# Patient Record
Sex: Male | Born: 1945 | Race: White | Hispanic: No | Marital: Married | Smoking: Never smoker
Health system: Southern US, Community
[De-identification: ages and names within clinical notes are randomized; demographics above are authoritative.]

## PROBLEM LIST (undated history)

## (undated) DIAGNOSIS — C61 Malignant neoplasm of prostate: Secondary | ICD-10-CM

## (undated) DIAGNOSIS — I10 Essential (primary) hypertension: Secondary | ICD-10-CM

## (undated) DIAGNOSIS — K219 Gastro-esophageal reflux disease without esophagitis: Secondary | ICD-10-CM

## (undated) HISTORY — PX: TONSILLECTOMY: SUR1361

---

## 2002-03-03 ENCOUNTER — Ambulatory Visit (HOSPITAL_COMMUNITY): Admission: RE | Admit: 2002-03-03 | Discharge: 2002-03-03 | Payer: Self-pay | Admitting: Gastroenterology

## 2002-03-03 ENCOUNTER — Encounter (INDEPENDENT_AMBULATORY_CARE_PROVIDER_SITE_OTHER): Payer: Self-pay | Admitting: Specialist

## 2006-08-18 ENCOUNTER — Encounter: Admission: RE | Admit: 2006-08-18 | Discharge: 2006-08-18 | Payer: Self-pay | Admitting: Internal Medicine

## 2008-09-11 IMAGING — US US ABDOMEN COMPLETE
1 series · 14 of 25 positions shown · non-contrast
Comparison: None.

CLINICAL DATA: Post prandial abdominal pain.  
 COMPLETE ABDOMINAL ULTRASOUND:
TECHNIQUE: Complete abdominal ultrasound examination was performed including evaluation of the liver, gallbladder, bile ducts, pancreas, kidneys, spleen, IVC, and abdominal aorta.

[Series 1: us abdomen complete · 0.28mm/px · 14 of 108 slices shown]
[im 1/108]
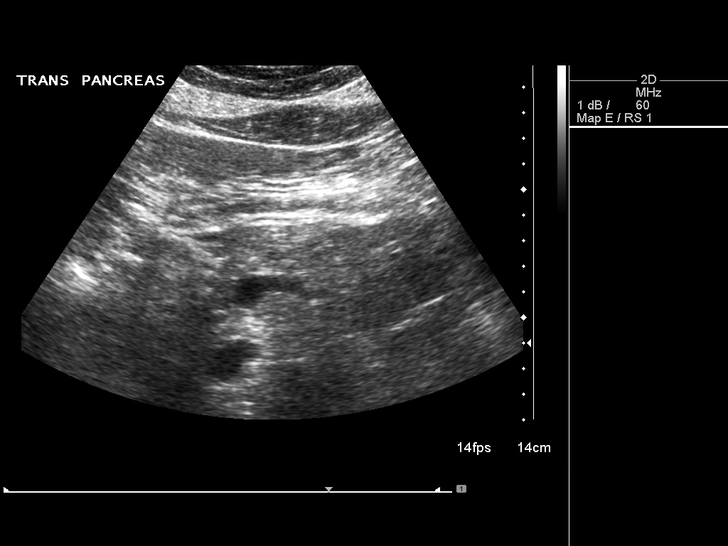
[im 9/108]
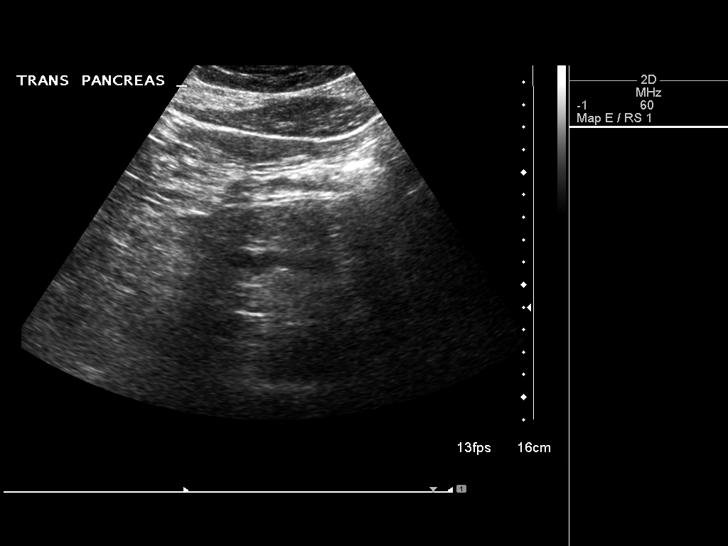
[im 18/108]
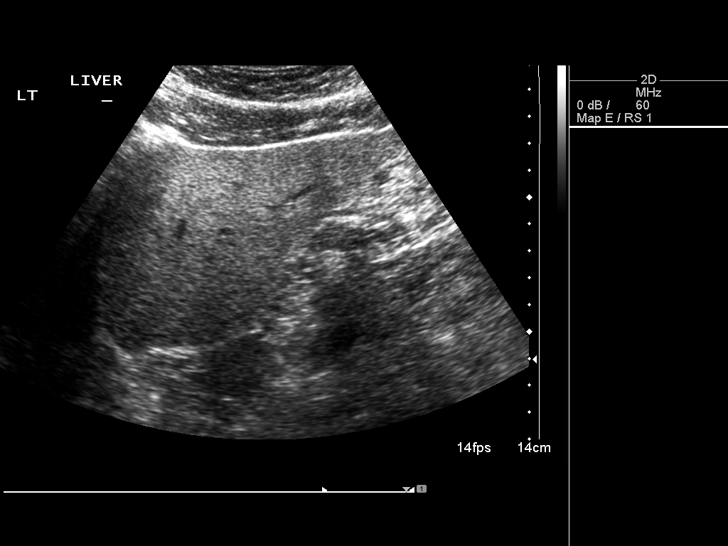
[im 27/108]
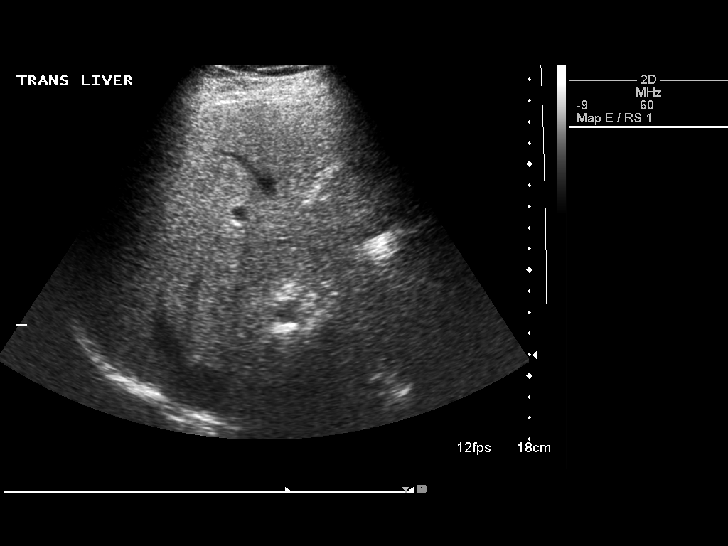
[im 36/108]
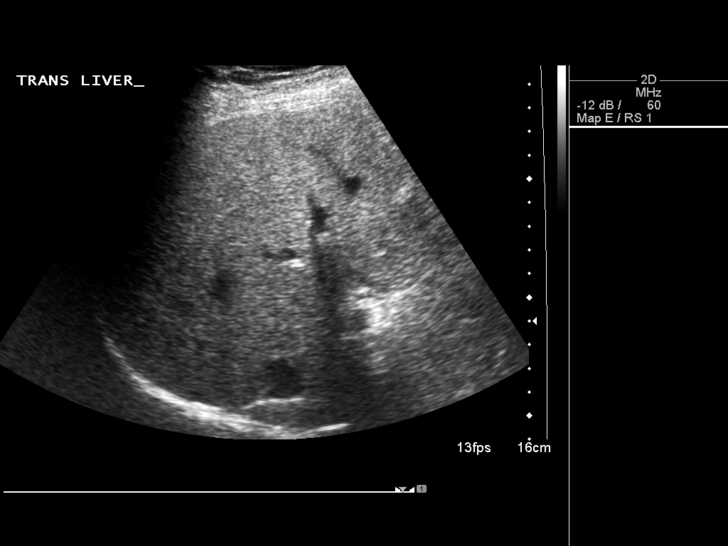
[im 41/108]
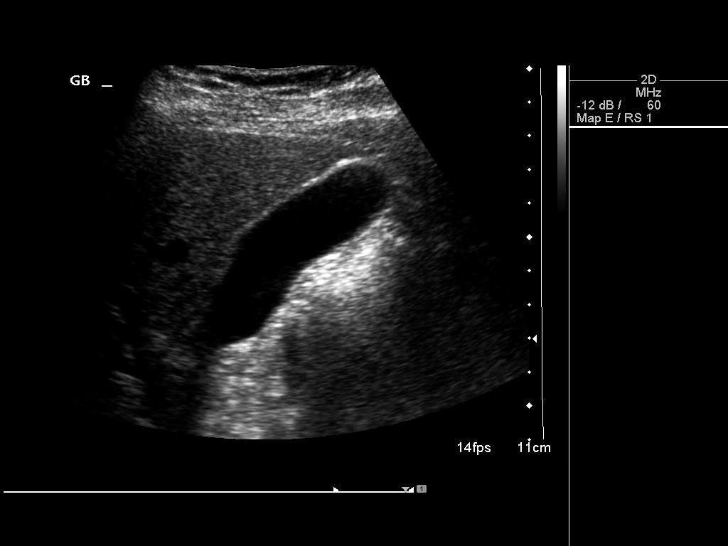
[im 50/108]
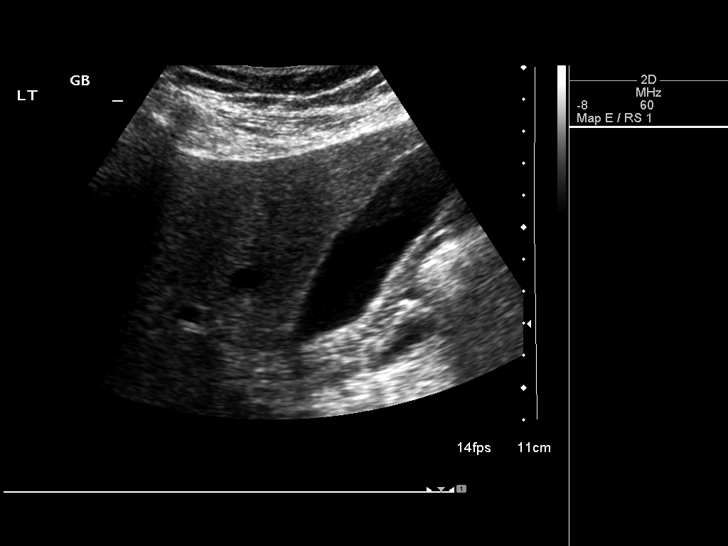
[im 58/108]
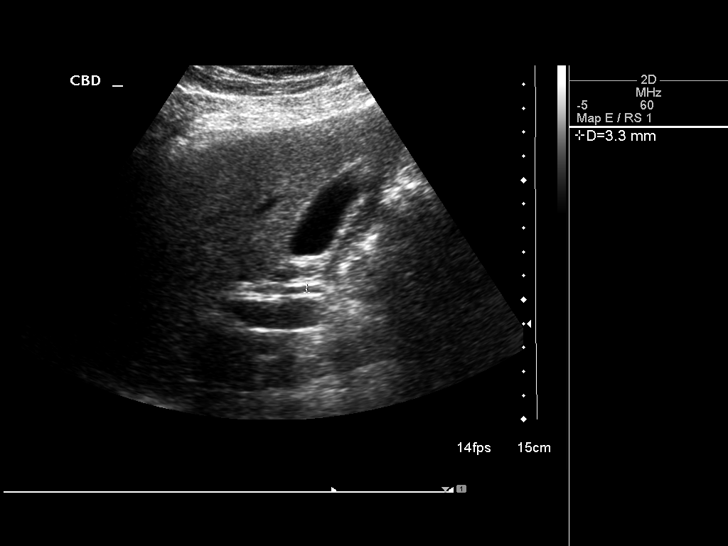
[im 67/108]
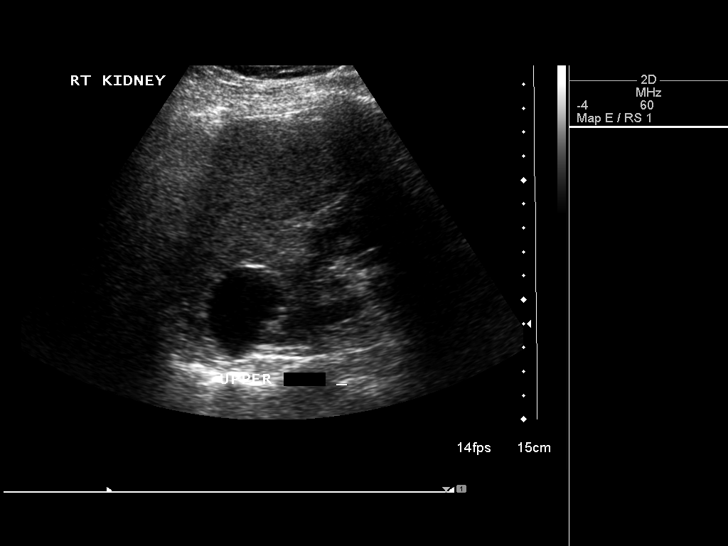
[im 72/108]
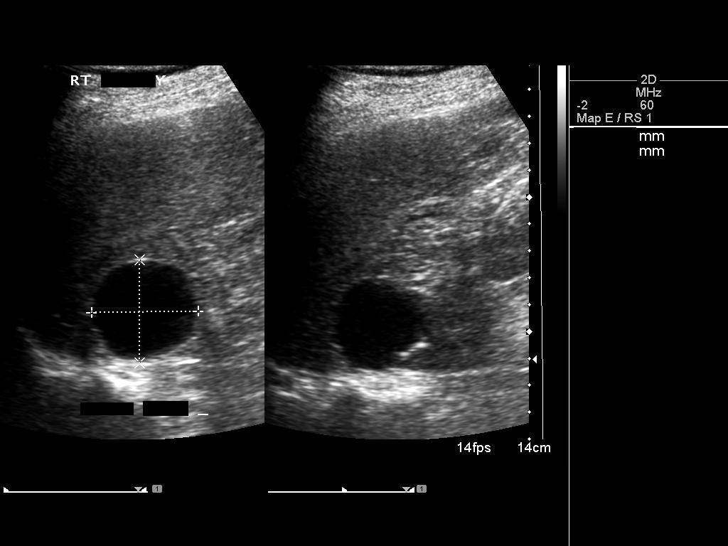
[im 81/108]
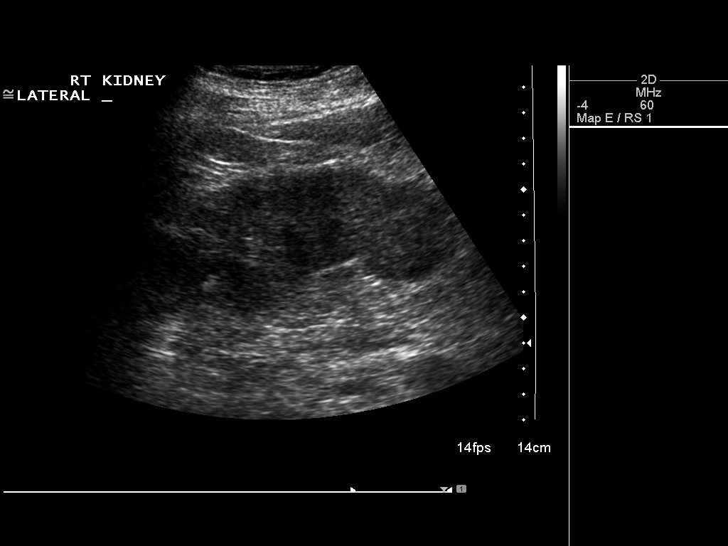
[im 90/108]
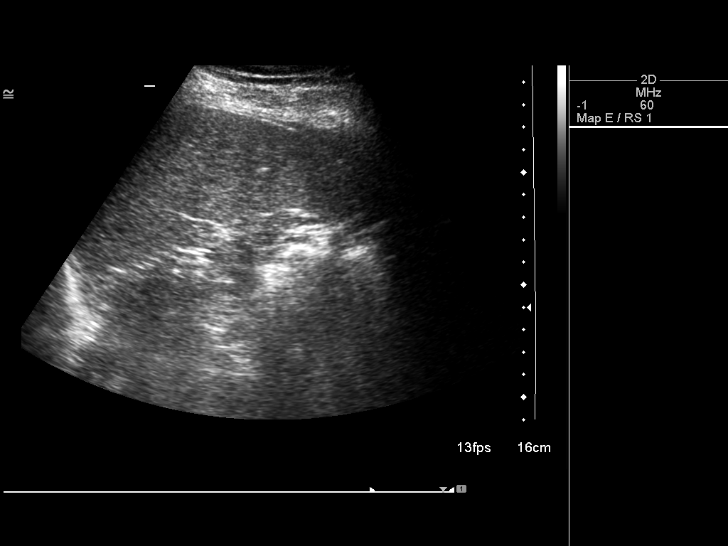
[im 99/108]
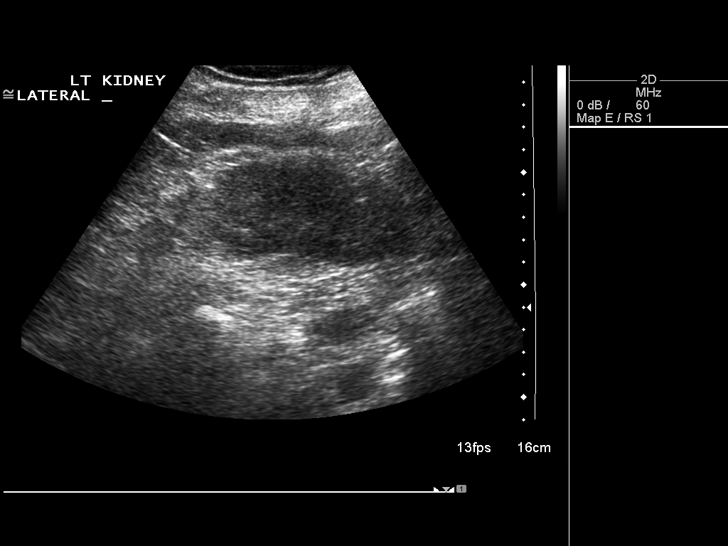
[im 108/108]
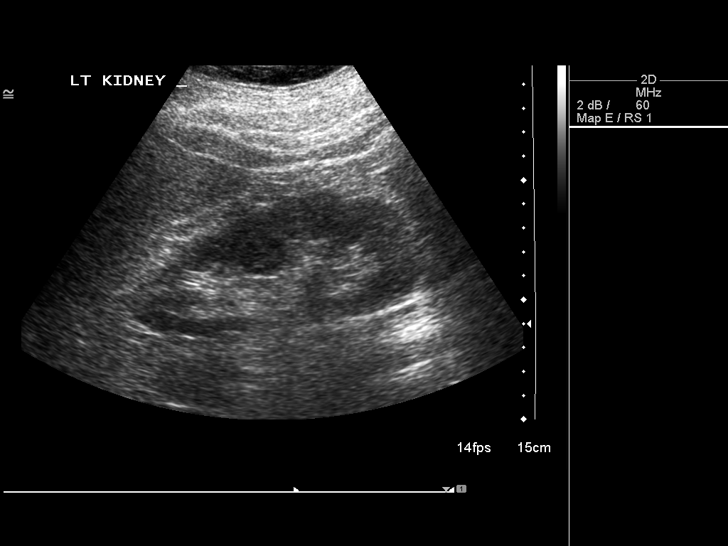

[14 of 25 positions shown; findings below may reference images not displayed]

FINDINGS: Liver, gallbladder, extrahepatic bile duct, and IVC are unremarkable.  Visualization of the pancreas is limited.  Spleen measures 13.2cm.  Right kidney contains a 4.0 x 3.8 x 3.8cm anechoic lesion with increased through transmission and calcification along the posterior wall.  Kidneys and aorta are otherwise unremarkable.
IMPRESSION: 1.  Mildly complex right renal cyst. 
 2.  Mild splenomegaly.

## 2010-06-15 NOTE — Op Note (Signed)
NAME:  [name], [name]                   ACCOUNT NO.:  [id number]   MEDICAL RECORD NO.:  [id number]                   PATIENT TYPE:  AMB   LOCATION:  ENDO                                 FACILITY:  [facility]   PHYSICIAN:  Danise Edge, M.D.                DATE OF BIRTH:  [birth year]   DATE OF PROCEDURE:  03/03/2002  DATE OF DISCHARGE:                                 OPERATIVE REPORT   PROCEDURES:  1. Esophagogastroduodenoscopy to evaluate gastroesophageal reflux     (heartburn).  2. Colonoscopy with rectal polypectomy.   INDICATIONS:  The patient is a 65 year old male born Jan 17, 2046.  On 12/30/01  the patient underwent a health maintenance flexible proctosigmoidoscopy, and  a small polyp was discovered at 10 cm from the anal verge.  The patient used  to have chronic gastroesophageal reflux (heartburn) but rarely has heartburn  presently.   ENDOSCOPIST:  Danise Edge, M.D.   PREMEDICATION:  Versed 10 mg, Demerol 50 mg.   ENDOSCOPE:  Olympus gastroscope and adult colonoscope.   DESCRIPTION OF PROCEDURE:  After obtaining informed consent, the patient was  placed in the left lateral decubitus position.  I administered intravenous  Demerol and intravenous Versed to achieve conscious sedation for the  procedure.  The patient's blood pressure, oxygen saturation, and cardiac  rhythm were monitored throughout the procedure and documented in the medical  record.   PROCEDURE:  Esophagogastroduodenoscopy.   FINDINGS:  The Olympus gastroscope was passed through the posterior  hypopharynx into the proximal esophagus without difficulty.  The  hypopharynx, larynx, and vocal cords appeared normal.   Esophagoscopy:  The proximal and midsegments of the esophagus appeared  normal.  There is a shallow Schatzki's ring at the esophagogastric junction,  which is noted at approximately 37 cm from the incisor teeth.  Endoscopically there is no evidence for the presence of erosive esophagitis,  Barrett's esophagus, or esophageal ulceration.   Gastroscopy:  There is a moderate-sized hiatal hernia.  Retroflexed view of  the gastric cardia and fundus was normal.  The diaphragmatic hiatus was  quite patulous.  The gastric body, antrum, and pylorus appeared normal.   Duodenoscopy:  There are scattered erosions in the duodenal bulb without  ulceration.  The patient is on 81 mg aspirin daily.  The descending duodenum  appeared normal.   ASSESSMENT:  1. Shallow Schatzki's ring at the esophagogastric junction associated with a     moderate-sized hiatal hernia.  2. Scattered erosions in the duodenal bulb with the patient on aspirin 81 mg     daily.  No peptic ulcer disease.   PROCEDURE:  Colonoscopy with rectal polypectomy.   FINDINGS:  Anal inspection was normal.  Digital rectal exam was normal.  The  Olympus adult colonoscope was introduced into the rectum and advanced to the  cecum.  Colonic preparation for the exam today was excellent.   Rectum:  At approximately 10 cm from the anal verge, a  2-3 mm sessile polyp  was removed with electrocautery snare and submitted for pathologic  interpretation.   Sigmoid colon and descending colon normal.   Splenic flexure normal.   Transverse colon normal.   Hepatic flexure normal.   Ascending colon normal.   Cecum and ileocecal valve normal.   ASSESSMENT:  A small rectal polyp was removed at 10 cm from the anal verge  and sent for pathologic interpretation.   RECOMMENDATIONS:  Repeat colonoscopy in five years.                                               Danise Edge, M.D.    MJ/MEDQ  D:  03/03/2002  T:  03/03/2002  Job:  [id number]   cc:   Candyce Churn, M.D.  [address] [place]  [place] [address]  Fax: [phone]

## 2011-03-18 DIAGNOSIS — E291 Testicular hypofunction: Secondary | ICD-10-CM | POA: Diagnosis not present

## 2011-09-17 DIAGNOSIS — N529 Male erectile dysfunction, unspecified: Secondary | ICD-10-CM | POA: Diagnosis not present

## 2011-09-17 DIAGNOSIS — E291 Testicular hypofunction: Secondary | ICD-10-CM | POA: Diagnosis not present

## 2012-01-16 DIAGNOSIS — N529 Male erectile dysfunction, unspecified: Secondary | ICD-10-CM | POA: Diagnosis not present

## 2012-01-16 DIAGNOSIS — Z0000 Encounter for general adult medical examination without abnormal findings: Secondary | ICD-10-CM | POA: Diagnosis not present

## 2012-01-16 DIAGNOSIS — K219 Gastro-esophageal reflux disease without esophagitis: Secondary | ICD-10-CM | POA: Diagnosis not present

## 2012-01-16 DIAGNOSIS — Z79899 Other long term (current) drug therapy: Secondary | ICD-10-CM | POA: Diagnosis not present

## 2012-01-16 DIAGNOSIS — M1710 Unilateral primary osteoarthritis, unspecified knee: Secondary | ICD-10-CM | POA: Diagnosis not present

## 2012-01-16 DIAGNOSIS — E291 Testicular hypofunction: Secondary | ICD-10-CM | POA: Diagnosis not present

## 2012-01-16 DIAGNOSIS — I10 Essential (primary) hypertension: Secondary | ICD-10-CM | POA: Diagnosis not present

## 2012-01-16 DIAGNOSIS — Z125 Encounter for screening for malignant neoplasm of prostate: Secondary | ICD-10-CM | POA: Diagnosis not present

## 2012-01-16 DIAGNOSIS — E7800 Pure hypercholesterolemia, unspecified: Secondary | ICD-10-CM | POA: Diagnosis not present

## 2012-02-26 DIAGNOSIS — N410 Acute prostatitis: Secondary | ICD-10-CM | POA: Diagnosis not present

## 2012-03-31 ENCOUNTER — Other Ambulatory Visit: Payer: Self-pay | Admitting: Gastroenterology

## 2012-03-31 DIAGNOSIS — K621 Rectal polyp: Secondary | ICD-10-CM | POA: Diagnosis not present

## 2012-03-31 DIAGNOSIS — Z86010 Personal history of colonic polyps: Secondary | ICD-10-CM | POA: Diagnosis not present

## 2012-03-31 DIAGNOSIS — K620 Anal polyp: Secondary | ICD-10-CM | POA: Diagnosis not present

## 2012-03-31 DIAGNOSIS — D126 Benign neoplasm of colon, unspecified: Secondary | ICD-10-CM | POA: Diagnosis not present

## 2012-03-31 DIAGNOSIS — Z09 Encounter for follow-up examination after completed treatment for conditions other than malignant neoplasm: Secondary | ICD-10-CM | POA: Diagnosis not present

## 2012-04-08 DIAGNOSIS — M25569 Pain in unspecified knee: Secondary | ICD-10-CM | POA: Diagnosis not present

## 2012-04-22 DIAGNOSIS — N410 Acute prostatitis: Secondary | ICD-10-CM | POA: Diagnosis not present

## 2012-04-22 DIAGNOSIS — R972 Elevated prostate specific antigen [PSA]: Secondary | ICD-10-CM | POA: Diagnosis not present

## 2012-05-28 DIAGNOSIS — N410 Acute prostatitis: Secondary | ICD-10-CM | POA: Diagnosis not present

## 2012-06-19 DIAGNOSIS — E291 Testicular hypofunction: Secondary | ICD-10-CM | POA: Diagnosis not present

## 2012-06-19 DIAGNOSIS — N529 Male erectile dysfunction, unspecified: Secondary | ICD-10-CM | POA: Diagnosis not present

## 2012-07-21 DIAGNOSIS — H524 Presbyopia: Secondary | ICD-10-CM | POA: Diagnosis not present

## 2012-07-21 DIAGNOSIS — H2510 Age-related nuclear cataract, unspecified eye: Secondary | ICD-10-CM | POA: Diagnosis not present

## 2012-07-21 DIAGNOSIS — H5200 Hypermetropia, unspecified eye: Secondary | ICD-10-CM | POA: Diagnosis not present

## 2012-07-21 DIAGNOSIS — H43819 Vitreous degeneration, unspecified eye: Secondary | ICD-10-CM | POA: Diagnosis not present

## 2012-10-31 DIAGNOSIS — Z23 Encounter for immunization: Secondary | ICD-10-CM | POA: Diagnosis not present

## 2013-01-19 DIAGNOSIS — E559 Vitamin D deficiency, unspecified: Secondary | ICD-10-CM | POA: Diagnosis not present

## 2013-01-19 DIAGNOSIS — Z125 Encounter for screening for malignant neoplasm of prostate: Secondary | ICD-10-CM | POA: Diagnosis not present

## 2013-01-19 DIAGNOSIS — Z0000 Encounter for general adult medical examination without abnormal findings: Secondary | ICD-10-CM | POA: Diagnosis not present

## 2013-01-19 DIAGNOSIS — Z79899 Other long term (current) drug therapy: Secondary | ICD-10-CM | POA: Diagnosis not present

## 2013-01-19 DIAGNOSIS — Z1331 Encounter for screening for depression: Secondary | ICD-10-CM | POA: Diagnosis not present

## 2013-01-19 DIAGNOSIS — Z86010 Personal history of colonic polyps: Secondary | ICD-10-CM | POA: Diagnosis not present

## 2013-01-19 DIAGNOSIS — N529 Male erectile dysfunction, unspecified: Secondary | ICD-10-CM | POA: Diagnosis not present

## 2013-01-19 DIAGNOSIS — E7800 Pure hypercholesterolemia, unspecified: Secondary | ICD-10-CM | POA: Diagnosis not present

## 2013-01-19 DIAGNOSIS — J309 Allergic rhinitis, unspecified: Secondary | ICD-10-CM | POA: Diagnosis not present

## 2013-01-19 DIAGNOSIS — E291 Testicular hypofunction: Secondary | ICD-10-CM | POA: Diagnosis not present

## 2013-01-19 DIAGNOSIS — I10 Essential (primary) hypertension: Secondary | ICD-10-CM | POA: Diagnosis not present

## 2013-01-19 DIAGNOSIS — K219 Gastro-esophageal reflux disease without esophagitis: Secondary | ICD-10-CM | POA: Diagnosis not present

## 2013-01-25 DIAGNOSIS — M25569 Pain in unspecified knee: Secondary | ICD-10-CM | POA: Diagnosis not present

## 2013-02-01 DIAGNOSIS — E291 Testicular hypofunction: Secondary | ICD-10-CM | POA: Diagnosis not present

## 2013-02-01 DIAGNOSIS — R972 Elevated prostate specific antigen [PSA]: Secondary | ICD-10-CM | POA: Diagnosis not present

## 2013-02-01 DIAGNOSIS — N529 Male erectile dysfunction, unspecified: Secondary | ICD-10-CM | POA: Diagnosis not present

## 2013-02-24 DIAGNOSIS — E039 Hypothyroidism, unspecified: Secondary | ICD-10-CM | POA: Diagnosis not present

## 2013-07-28 DIAGNOSIS — C61 Malignant neoplasm of prostate: Secondary | ICD-10-CM

## 2013-07-28 HISTORY — DX: Malignant neoplasm of prostate: C61

## 2013-08-03 DIAGNOSIS — N529 Male erectile dysfunction, unspecified: Secondary | ICD-10-CM | POA: Diagnosis not present

## 2013-08-03 DIAGNOSIS — R972 Elevated prostate specific antigen [PSA]: Secondary | ICD-10-CM | POA: Diagnosis not present

## 2013-08-03 DIAGNOSIS — E291 Testicular hypofunction: Secondary | ICD-10-CM | POA: Diagnosis not present

## 2013-08-23 DIAGNOSIS — C61 Malignant neoplasm of prostate: Secondary | ICD-10-CM | POA: Diagnosis not present

## 2013-08-23 DIAGNOSIS — R972 Elevated prostate specific antigen [PSA]: Secondary | ICD-10-CM | POA: Diagnosis not present

## 2013-08-23 HISTORY — PX: PROSTATE BIOPSY: SHX241

## 2013-08-30 DIAGNOSIS — E291 Testicular hypofunction: Secondary | ICD-10-CM | POA: Diagnosis not present

## 2013-08-30 DIAGNOSIS — C61 Malignant neoplasm of prostate: Secondary | ICD-10-CM | POA: Diagnosis not present

## 2013-09-30 DIAGNOSIS — C61 Malignant neoplasm of prostate: Secondary | ICD-10-CM | POA: Diagnosis not present

## 2013-10-30 DIAGNOSIS — Z23 Encounter for immunization: Secondary | ICD-10-CM | POA: Diagnosis not present

## 2013-12-27 DIAGNOSIS — C61 Malignant neoplasm of prostate: Secondary | ICD-10-CM | POA: Diagnosis not present

## 2013-12-31 DIAGNOSIS — E291 Testicular hypofunction: Secondary | ICD-10-CM | POA: Diagnosis not present

## 2013-12-31 DIAGNOSIS — C61 Malignant neoplasm of prostate: Secondary | ICD-10-CM | POA: Diagnosis not present

## 2014-03-01 DIAGNOSIS — K219 Gastro-esophageal reflux disease without esophagitis: Secondary | ICD-10-CM | POA: Diagnosis not present

## 2014-03-01 DIAGNOSIS — E559 Vitamin D deficiency, unspecified: Secondary | ICD-10-CM | POA: Diagnosis not present

## 2014-03-01 DIAGNOSIS — Z0001 Encounter for general adult medical examination with abnormal findings: Secondary | ICD-10-CM | POA: Diagnosis not present

## 2014-03-01 DIAGNOSIS — E291 Testicular hypofunction: Secondary | ICD-10-CM | POA: Diagnosis not present

## 2014-03-01 DIAGNOSIS — I10 Essential (primary) hypertension: Secondary | ICD-10-CM | POA: Diagnosis not present

## 2014-03-01 DIAGNOSIS — N529 Male erectile dysfunction, unspecified: Secondary | ICD-10-CM | POA: Diagnosis not present

## 2014-03-01 DIAGNOSIS — Z79899 Other long term (current) drug therapy: Secondary | ICD-10-CM | POA: Diagnosis not present

## 2014-03-01 DIAGNOSIS — Z1389 Encounter for screening for other disorder: Secondary | ICD-10-CM | POA: Diagnosis not present

## 2014-03-01 DIAGNOSIS — E782 Mixed hyperlipidemia: Secondary | ICD-10-CM | POA: Diagnosis not present

## 2014-03-01 DIAGNOSIS — Z23 Encounter for immunization: Secondary | ICD-10-CM | POA: Diagnosis not present

## 2014-03-01 DIAGNOSIS — C61 Malignant neoplasm of prostate: Secondary | ICD-10-CM | POA: Diagnosis not present

## 2014-03-01 DIAGNOSIS — Z86010 Personal history of colonic polyps: Secondary | ICD-10-CM | POA: Diagnosis not present

## 2014-05-03 DIAGNOSIS — C61 Malignant neoplasm of prostate: Secondary | ICD-10-CM | POA: Diagnosis not present

## 2014-05-03 DIAGNOSIS — E291 Testicular hypofunction: Secondary | ICD-10-CM | POA: Diagnosis not present

## 2014-05-17 DIAGNOSIS — I10 Essential (primary) hypertension: Secondary | ICD-10-CM | POA: Diagnosis not present

## 2014-05-17 DIAGNOSIS — H2513 Age-related nuclear cataract, bilateral: Secondary | ICD-10-CM | POA: Diagnosis not present

## 2014-05-17 DIAGNOSIS — H35033 Hypertensive retinopathy, bilateral: Secondary | ICD-10-CM | POA: Diagnosis not present

## 2014-05-17 DIAGNOSIS — H43813 Vitreous degeneration, bilateral: Secondary | ICD-10-CM | POA: Diagnosis not present

## 2014-08-15 DIAGNOSIS — C61 Malignant neoplasm of prostate: Secondary | ICD-10-CM | POA: Diagnosis not present

## 2014-08-15 HISTORY — PX: PROSTATE BIOPSY: SHX241

## 2014-08-29 DIAGNOSIS — C61 Malignant neoplasm of prostate: Secondary | ICD-10-CM | POA: Diagnosis not present

## 2014-08-31 DIAGNOSIS — S01511A Laceration without foreign body of lip, initial encounter: Secondary | ICD-10-CM | POA: Diagnosis not present

## 2014-09-09 DIAGNOSIS — C61 Malignant neoplasm of prostate: Secondary | ICD-10-CM | POA: Diagnosis not present

## 2014-09-20 ENCOUNTER — Encounter: Payer: Self-pay | Admitting: Radiation Oncology

## 2014-09-20 NOTE — Progress Notes (Signed)
GU Location of Tumor / Histology: Adenocarcinoma of the Prostate     If Prostate Cancer, Gleason Score is (4 + 3) and PSA is (4.69)  [name] presented in July 2015 with a low risk prostate cancer.  His PSA at that time was 5.9.  Digital exam revealed "unremarkable and ultrasound revealed a 57 gram prostate without any worrisome features." Gleason score of 3+3=6 at the right apex with 5% core involvement.  Active Surveillance was recommended and he agreed.  With hormone replacement therapy his PSA declined to 3.4 in November 2015, but is off now.  PSA in April was 4.6 which was an increase since the November PSA of 3.4.  Repeat Biopsy July 2016 noted Adenocarcinoma in a single core at the apex in the medial location.  His gleason score changed form a 3+3=6 to a 4+3=7 with 10% core involvement which is classified as intermediate risk.  Past/Anticipated interventions by urology, if any: Prostate Biopsy  Past/Anticipated interventions by medical oncology, if ZGY:FVCBSWH  Weight changes, if any: none  Bowel/Bladder complaints, if any: Intermittent straining to void, complete emptying, No nocturia  Nausea/Vomiting, if any: nausea  Pain issues, if any: None  SAFETY ISSUES:  Prior radiation? No  Pacemaker/ICD? No  Possible current pregnancy?No  Is the patient on methotrexate?No  Current Complaints / other details:

## 2014-09-21 ENCOUNTER — Ambulatory Visit
Admission: RE | Admit: 2014-09-21 | Discharge: 2014-09-21 | Disposition: A | Discharge status: Home / Self Care | Payer: Medicare Other | Source: Ambulatory Visit | Attending: Radiation Oncology | Admitting: Radiation Oncology

## 2014-09-21 ENCOUNTER — Encounter: Payer: Self-pay | Admitting: Radiation Oncology

## 2014-09-21 VITALS — BP 146/87 | HR 61 | Temp 98.2°F | Ht 73.0 in | Wt 215.1 lb

## 2014-09-21 DIAGNOSIS — K219 Gastro-esophageal reflux disease without esophagitis: Secondary | ICD-10-CM | POA: Diagnosis not present

## 2014-09-21 DIAGNOSIS — C61 Malignant neoplasm of prostate: Secondary | ICD-10-CM

## 2014-09-21 DIAGNOSIS — Z7982 Long term (current) use of aspirin: Secondary | ICD-10-CM | POA: Insufficient documentation

## 2014-09-21 DIAGNOSIS — I10 Essential (primary) hypertension: Secondary | ICD-10-CM | POA: Insufficient documentation

## 2014-09-21 DIAGNOSIS — Z79899 Other long term (current) drug therapy: Secondary | ICD-10-CM | POA: Insufficient documentation

## 2014-09-21 HISTORY — DX: Gastro-esophageal reflux disease without esophagitis: K21.9

## 2014-09-21 HISTORY — DX: Malignant neoplasm of prostate: C61

## 2014-09-21 HISTORY — DX: Essential (primary) hypertension: I10

## 2014-09-21 NOTE — Progress Notes (Signed)
[facility] NEW PATIENT EVALUATION  Name: [name] MRN: [id number]  Date:   09/21/2014           DOB: 05-04-1945  Status: outpatient   CC: No primary care provider on file.  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 male who is seen today through the courtesy Dr. 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  His PSA rose to 5.9 in 2015.  Of note is that he was on androgen supplementation at that time.  He underwent ultrasound-guided biopsies with Dr. Risa Grill on 08/23/2013 and 5% of one core from the right apex was diagnostic for Gleason 6 (3+3) adenocarcinoma.  He appropriately elected for active surveillance.  He came off his hormone replacement therapy and his PSA came down to 3.4 by 12/27/2013.  It rose back to 4.69 by 05/03/2014 and he underwent repeat biopsies on 08/15/2014 showing Gleason 7 (4+3) involving 10% of one core from the right apex.  His gland volume was 58 mL.  Based on his intermediate risk group, Dr. Risa Grill discussed with him robotic surgery and has him seen here today for discussion of radiation therapy.  He is doing well from a GU and GI standpoint.  His I PSS score is 3.  He does have erectile dysfunction which responds well to sildenafil.  He is quite active for his age.  PREVIOUS RADIATION THERAPY: No   PAST MEDICAL HISTORY:  has a past medical history of Prostate cancer (07/2013); Esophageal reflux; and Hypertension.     PAST SURGICAL HISTORY:  Past Surgical History  Procedure Laterality Date  . Prostate biopsy  08/23/2013  . Prostate biopsy  08/15/14  . Tonsillectomy       FAMILY HISTORY: family history includes Breast cancer in his mother; Stroke in his father. His father died  following a stroke at 62.  His mother died from old age at 56.  Multiple maternal aunts lived to be well into their 75s.  No family history of prostate cancer.   SOCIAL HISTORY:  reports that he has never smoked. He does not have any smokeless tobacco history on file. He reports that he drinks about 4.2 oz of alcohol per week. He reports that he uses illicit drugs.  Married, 2 children.  Retired from [employer].   ALLERGIES: Review of patient's allergies indicates no known allergies.   MEDICATIONS:  Current Outpatient Prescriptions  Medication Sig Dispense Refill  . aspirin 81 MG tablet Take 81 mg by mouth daily.    Marland Kitchen lisinopril (PRINIVIL,ZESTRIL) 20 MG tablet Take 20 mg by mouth daily. Takes 1/2 tablet    . Omega-3 Fatty Acids (FISH OIL) 500 MG CAPS Take by mouth.    Marland Kitchen omeprazole (PRILOSEC OTC) 20 MG tablet Take 20 mg by mouth daily.    . rosuvastatin (CRESTOR) 40 MG tablet Take 40 mg by mouth daily. Takes 1/2 tab daily    . sildenafil (VIAGRA) 100 MG tablet Take 100 mg by mouth daily as needed for erectile dysfunction.     No current facility-administered medications for this encounter.     REVIEW OF SYSTEMS:  Pertinent items are noted in HPI.    PHYSICAL EXAM:  height is 6\' 1"  (1.854 m) and weight is 215  lb 1.6 oz (97.569 kg). His temperature is 98.2 F (36.8 C). His blood pressure is 146/87 and his pulse is 61.   Alert and oriented 68 year old white male appearing his stated age.  Rectal examination: The prostate gland is slightly enlarged and is without focal induration or nodularity.   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  I explained to the patient that his prognosis is related to his stage, Gleason score, and PSA level.  His stage and PSA level are  favorable although his Gleason score 7 is of intermediate favorability.  Other prognostic factors include PSA doubling time and also disease volume.  He has low disease volume.  Management options include surgery versus radiation therapy.  Radiation therapy options include seed implantation alone or 8 weeks of external beam/IMRT.  We discussed in detail the both radiation therapy options.  We also discussed the potential acute and late toxicities of radiation therapy.  We discussed radiation safety issues with seed implantation.  His prognosis is quite favorable.  He is interested in either surgery or seed implantation which I think are both good choices for him.  He would like to wait until after the hunting season, perhaps later this year, before deciding on his course of therapy.  He knows that we will need to obtain a CT arch study should he want to proceed with seed implantation.  He'll maintain follow-up with Dr. Risa Grill.   PLAN: As discussed above.   I spent 60  minutes face to face with the patient and more than 50% of that time was spent in counseling and/or coordination of care.

## 2014-09-21 NOTE — Addendum Note (Signed)
Encounter addended by: Benn Moulder, RN on: 09/21/2014  7:20 PM<BR>     Documentation filed: Charges VN

## 2014-10-18 DIAGNOSIS — E291 Testicular hypofunction: Secondary | ICD-10-CM | POA: Diagnosis not present

## 2014-10-18 DIAGNOSIS — C61 Malignant neoplasm of prostate: Secondary | ICD-10-CM | POA: Diagnosis not present

## 2014-10-28 ENCOUNTER — Other Ambulatory Visit: Payer: Self-pay | Admitting: Urology

## 2014-11-01 ENCOUNTER — Other Ambulatory Visit: Payer: Self-pay | Admitting: Urology

## 2014-11-16 DIAGNOSIS — M6281 Muscle weakness (generalized): Secondary | ICD-10-CM | POA: Diagnosis not present

## 2014-11-16 DIAGNOSIS — Z8546 Personal history of malignant neoplasm of prostate: Secondary | ICD-10-CM | POA: Diagnosis not present

## 2014-11-16 DIAGNOSIS — C61 Malignant neoplasm of prostate: Secondary | ICD-10-CM | POA: Diagnosis not present

## 2014-11-16 DIAGNOSIS — Z0000 Encounter for general adult medical examination without abnormal findings: Secondary | ICD-10-CM | POA: Diagnosis not present

## 2014-11-28 DIAGNOSIS — C61 Malignant neoplasm of prostate: Secondary | ICD-10-CM | POA: Diagnosis not present

## 2014-11-28 DIAGNOSIS — M6281 Muscle weakness (generalized): Secondary | ICD-10-CM | POA: Diagnosis not present

## 2015-01-02 ENCOUNTER — Encounter (HOSPITAL_COMMUNITY): Payer: Self-pay

## 2015-01-02 ENCOUNTER — Encounter (HOSPITAL_COMMUNITY)
Admission: RE | Admit: 2015-01-02 | Discharge: 2015-01-02 | Disposition: A | Discharge status: Home / Self Care | Payer: Medicare Other | Source: Ambulatory Visit | Attending: Urology | Admitting: Urology

## 2015-01-02 ENCOUNTER — Telehealth: Payer: Self-pay | Admitting: Radiation Oncology

## 2015-01-02 DIAGNOSIS — E785 Hyperlipidemia, unspecified: Secondary | ICD-10-CM | POA: Diagnosis not present

## 2015-01-02 DIAGNOSIS — I10 Essential (primary) hypertension: Secondary | ICD-10-CM | POA: Diagnosis not present

## 2015-01-02 DIAGNOSIS — C61 Malignant neoplasm of prostate: Secondary | ICD-10-CM | POA: Diagnosis not present

## 2015-01-02 DIAGNOSIS — K219 Gastro-esophageal reflux disease without esophagitis: Secondary | ICD-10-CM | POA: Diagnosis not present

## 2015-01-02 LAB — CBC
HCT: 43.2 % (ref 39.0–52.0)
Hemoglobin: 14.5 g/dL (ref 13.0–17.0)
MCH: 30.4 pg (ref 26.0–34.0)
MCHC: 33.6 g/dL (ref 30.0–36.0)
MCV: 90.6 fL (ref 78.0–100.0)
PLATELETS: 210 10*3/uL (ref 150–400)
RBC: 4.77 MIL/uL (ref 4.22–5.81)
RDW: 12.5 % (ref 11.5–15.5)
WBC: 5.3 10*3/uL (ref 4.0–10.5)

## 2015-01-02 LAB — BASIC METABOLIC PANEL
Anion gap: 7 (ref 5–15)
BUN: 17 mg/dL (ref 6–20)
CALCIUM: 9.1 mg/dL (ref 8.9–10.3)
CO2: 27 mmol/L (ref 22–32)
CREATININE: 0.93 mg/dL (ref 0.61–1.24)
Chloride: 105 mmol/L (ref 101–111)
GLUCOSE: 102 mg/dL — AB (ref 65–99)
Potassium: 5.2 mmol/L — ABNORMAL HIGH (ref 3.5–5.1)
Sodium: 139 mmol/L (ref 135–145)

## 2015-01-02 LAB — ABO/RH: ABO/RH(D): O POS

## 2015-01-02 NOTE — Pre-Procedure Instructions (Signed)
EKG done today.

## 2015-01-02 NOTE — Telephone Encounter (Signed)
Received message that patient called over the weekend wanting to know what time his appointment was for Monday. Phoned patient making him aware he had phoned Dr. Katharina Caper office instead of Dr. Clovis Cao office. Provided patient with the number to [facility]. Patient expressed appreciation for the call.

## 2015-01-02 NOTE — Patient Instructions (Addendum)
[place]  01/02/2015   Your procedure is scheduled on:   12-7--2016 Wednesday  Enter through [facility] and follow signs to [facility] to [place]. Arrive at    0630    AM.  (Limit 1 person with you).  Call this number if you have problems the morning of surgery: [phone]  Or Presurgical Testing [phone] days before.   For Living Will and/or Health Care Power Attorney Forms: please provide copy for your medical record,may bring AM of surgery(Forms should be already notarized -we do not provide this service).(Yes- prefers not to bring at this time.).  Remember: Follow any bowel prep instructions per MD office.   Do not eat food/ or drink: After Midnight.     Take these medicines the morning of surgery with A SIP OF WATER-   (DO NOT TAKE ANY DIABETIC MEDS AM OF SURGERY) : NONE.   Do not wear jewelry, make-up or nail polish.  Do not wear deodorant, lotions, powders, or perfumes.   Do not shave legs and under arms- 48 hours(2 days) prior to first CHG shower.(Shaving face and neck okay.)  Do not bring valuables to the hospital.(Hospital is not responsible for lost valuables).  Contacts, dentures or removable bridgework, body piercing, hair pins may not be worn into surgery.  Leave suitcase in the car. After surgery it may be brought to your room.  For patients admitted to the hospital, checkout time is 11:00 AM the day of discharge.(Restricted visitors-Any Persons displaying flu-like symptoms or illness).    Patients discharged the day of surgery will not be allowed to drive home. Must have responsible person with you x 24 hours once discharged.  Name and phone number of your driver: Jenny Reichmann -spouse [phone] cell    Please read over the following fact sheets that you were given:  CHG(Chlorhexidine Gluconate 4% Surgical Soap) use.  Remember : Type/Screen "Blue armbands" - may not be removed once applied(would result in being  retested AM of surgery, if removed).          - Preparing for Surgery Before surgery, you can play an important role.  Because skin is not sterile, your skin needs to be as free of germs as possible.  You can reduce the number of germs on your skin by washing with CHG (chlorahexidine gluconate) soap before surgery.  CHG is an antiseptic cleaner which kills germs and bonds with the skin to continue killing germs even after washing. Please DO NOT use if you have an allergy to CHG or antibacterial soaps.  If your skin becomes reddened/irritated stop using the CHG and inform your nurse when you arrive at Short Stay. Do not shave (including legs and underarms) for at least 48 hours prior to the first CHG shower.  You may shave your face/neck. Please follow these instructions carefully:  1.  Shower with CHG Soap the night before surgery and the  morning of Surgery.  2.  If you choose to wash your hair, wash your hair first as usual with your  normal  shampoo.  3.  After you shampoo, rinse your hair and body thoroughly to remove the  shampoo.                           4.  Use CHG as you would any other liquid soap.  You can apply chg directly  to the skin and  wash                       Gently with a scrungie or clean washcloth.  5.  Apply the CHG Soap to your body ONLY FROM THE NECK DOWN.   Do not use on face/ open                           Wound or open sores. Avoid contact with eyes, ears mouth and genitals (private parts).                       Wash face,  Genitals (private parts) with your normal soap.             6.  Wash thoroughly, paying special attention to the area where your surgery  will be performed.  7.  Thoroughly rinse your body with warm water from the neck down.  8.  DO NOT shower/wash with your normal soap after using and rinsing off  the CHG Soap.                9.  Pat yourself dry with a clean towel.            10.  Wear clean pajamas.            11.  Place clean  sheets on your bed the night of your first shower and do not  sleep with pets. Day of Surgery : Do not apply any lotions/deodorants the morning of surgery.  Please wear clean clothes to the hospital/surgery center.  FAILURE TO FOLLOW THESE INSTRUCTIONS MAY RESULT IN THE CANCELLATION OF YOUR SURGERY PATIENT SIGNATURE_________________________________  NURSE SIGNATURE__________________________________  ________________________________________________________________________

## 2015-01-03 NOTE — Anesthesia Preprocedure Evaluation (Addendum)
Anesthesia Evaluation  Patient identified by MRN, date of birth, ID band Patient awake    Reviewed: Allergy & Precautions, NPO status , Patient's Chart, lab work & pertinent test results  History of Anesthesia Complications Negative for: history of anesthetic complications  Airway Mallampati: II  TM Distance: >3 FB Neck ROM: Full    Dental no notable dental hx. (+) Dental Advisory Given   Pulmonary neg pulmonary ROS,    Pulmonary exam normal breath sounds clear to auscultation       Cardiovascular hypertension, Pt. on medications Normal cardiovascular exam Rhythm:Regular Rate:Normal     Neuro/Psych negative neurological ROS  negative psych ROS   GI/Hepatic Neg liver ROS, GERD  ,  Endo/Other  negative endocrine ROS  Renal/GU negative Renal ROS  negative genitourinary   Musculoskeletal negative musculoskeletal ROS (+)   Abdominal   Peds negative pediatric ROS (+)  Hematology negative hematology ROS (+)   Anesthesia Other Findings Prostate Ca  Reproductive/Obstetrics negative OB ROS                            Anesthesia Physical Anesthesia Plan  ASA: II  Anesthesia Plan: General   Post-op Pain Management:    Induction: Intravenous  Airway Management Planned: Oral ETT  Additional Equipment:   Intra-op Plan:   Post-operative Plan: Extubation in OR  Informed Consent: I have reviewed the patients History and Physical, chart, labs and discussed the procedure including the risks, benefits and alternatives for the proposed anesthesia with the patient or authorized representative who has indicated his/her understanding and acceptance.   Dental advisory given  Plan Discussed with: CRNA  Anesthesia Plan Comments:         Anesthesia Quick Evaluation

## 2015-01-04 ENCOUNTER — Inpatient Hospital Stay (HOSPITAL_COMMUNITY): Payer: Medicare Other | Admitting: Anesthesiology

## 2015-01-04 ENCOUNTER — Encounter (HOSPITAL_COMMUNITY): Payer: Self-pay | Admitting: *Deleted

## 2015-01-04 ENCOUNTER — Encounter (HOSPITAL_COMMUNITY)
Admission: RE | Disposition: A | Discharge status: Home / Self Care | Payer: Self-pay | Source: Ambulatory Visit | Attending: Urology

## 2015-01-04 ENCOUNTER — Inpatient Hospital Stay (HOSPITAL_COMMUNITY)
Admission: RE | Admit: 2015-01-04 | Discharge: 2015-01-05 | DRG: 708 | Disposition: A | Discharge status: Home / Self Care | Payer: Medicare Other | Source: Ambulatory Visit | Attending: Urology | Admitting: Urology

## 2015-01-04 DIAGNOSIS — K219 Gastro-esophageal reflux disease without esophagitis: Secondary | ICD-10-CM | POA: Diagnosis present

## 2015-01-04 DIAGNOSIS — I10 Essential (primary) hypertension: Secondary | ICD-10-CM | POA: Diagnosis present

## 2015-01-04 DIAGNOSIS — Z823 Family history of stroke: Secondary | ICD-10-CM | POA: Diagnosis not present

## 2015-01-04 DIAGNOSIS — Z7982 Long term (current) use of aspirin: Secondary | ICD-10-CM

## 2015-01-04 DIAGNOSIS — Z01812 Encounter for preprocedural laboratory examination: Secondary | ICD-10-CM | POA: Diagnosis not present

## 2015-01-04 DIAGNOSIS — C61 Malignant neoplasm of prostate: Secondary | ICD-10-CM | POA: Diagnosis present

## 2015-01-04 DIAGNOSIS — Z79899 Other long term (current) drug therapy: Secondary | ICD-10-CM | POA: Diagnosis not present

## 2015-01-04 DIAGNOSIS — E785 Hyperlipidemia, unspecified: Secondary | ICD-10-CM | POA: Diagnosis present

## 2015-01-04 DIAGNOSIS — Z803 Family history of malignant neoplasm of breast: Secondary | ICD-10-CM | POA: Diagnosis not present

## 2015-01-04 HISTORY — PX: ROBOT ASSISTED LAPAROSCOPIC RADICAL PROSTATECTOMY: SHX5141

## 2015-01-04 HISTORY — PX: LYMPHADENECTOMY: SHX5960

## 2015-01-04 LAB — TYPE AND SCREEN
ABO/RH(D): O POS
Antibody Screen: NEGATIVE

## 2015-01-04 LAB — HEMOGLOBIN AND HEMATOCRIT, BLOOD
HEMATOCRIT: 41.1 % (ref 39.0–52.0)
Hemoglobin: 13.4 g/dL (ref 13.0–17.0)

## 2015-01-04 SURGERY — ROBOTIC ASSISTED LAPAROSCOPIC RADICAL PROSTATECTOMY
Anesthesia: General

## 2015-01-04 MED ORDER — ONDANSETRON HCL 4 MG/2ML IJ SOLN
INTRAMUSCULAR | Status: AC
  Filled 2015-01-04: qty 2

## 2015-01-04 MED ORDER — ROCURONIUM BROMIDE 100 MG/10ML IV SOLN
INTRAVENOUS | Status: AC
  Filled 2015-01-04: qty 1

## 2015-01-04 MED ORDER — ONDANSETRON HCL 4 MG/2ML IJ SOLN
4.0000 mg | INTRAMUSCULAR | Status: DC | PRN
  Administered 2015-01-05: 4 mg via INTRAVENOUS
  Filled 2015-01-04: qty 2

## 2015-01-04 MED ORDER — ONDANSETRON HCL 4 MG/2ML IJ SOLN: 4.0000 mg | Freq: Once | INTRAMUSCULAR | Status: DC | PRN

## 2015-01-04 MED ORDER — LACTATED RINGERS IR SOLN
Status: DC | PRN
  Administered 2015-01-04: 1

## 2015-01-04 MED ORDER — DIPHENHYDRAMINE HCL 50 MG/ML IJ SOLN: 12.5000 mg | Freq: Four times a day (QID) | INTRAMUSCULAR | Status: DC | PRN

## 2015-01-04 MED ORDER — OMEPRAZOLE MAGNESIUM 20 MG PO TBEC: 20.0000 mg | DELAYED_RELEASE_TABLET | ORAL | Status: DC

## 2015-01-04 MED ORDER — SODIUM CHLORIDE 0.9 % IV BOLUS (SEPSIS)
1000.0000 mL | Freq: Once | INTRAVENOUS | Status: AC
  Administered 2015-01-04: 1000 mL via INTRAVENOUS

## 2015-01-04 MED ORDER — DEXAMETHASONE SODIUM PHOSPHATE 10 MG/ML IJ SOLN
INTRAMUSCULAR | Status: AC
  Filled 2015-01-04: qty 1

## 2015-01-04 MED ORDER — OMEPRAZOLE MAGNESIUM 20 MG PO TBEC
20.0000 mg | DELAYED_RELEASE_TABLET | ORAL | Status: DC
  Administered 2015-01-04: 20 mg via ORAL
  Filled 2015-01-04: qty 1

## 2015-01-04 MED ORDER — SODIUM CHLORIDE 0.9 % IJ SOLN
INTRAMUSCULAR | Status: AC
  Filled 2015-01-04: qty 10

## 2015-01-04 MED ORDER — SUGAMMADEX SODIUM 500 MG/5ML IV SOLN
INTRAVENOUS | Status: DC | PRN
  Administered 2015-01-04: 400 mg via INTRAVENOUS

## 2015-01-04 MED ORDER — ROSUVASTATIN CALCIUM 20 MG PO TABS
20.0000 mg | ORAL_TABLET | Freq: Every day | ORAL | Status: DC
  Administered 2015-01-04: 20 mg via ORAL
  Filled 2015-01-04: qty 1

## 2015-01-04 MED ORDER — FENTANYL CITRATE (PF) 100 MCG/2ML IJ SOLN: 25.0000 ug | INTRAMUSCULAR | Status: DC | PRN

## 2015-01-04 MED ORDER — OXYCODONE HCL 5 MG PO TABS: 5.0000 mg | ORAL_TABLET | ORAL | Status: DC | PRN

## 2015-01-04 MED ORDER — DIPHENHYDRAMINE HCL 12.5 MG/5ML PO ELIX
12.5000 mg | ORAL_SOLUTION | Freq: Four times a day (QID) | ORAL | Status: DC | PRN
Start: 2015-01-04 — End: 2015-01-05

## 2015-01-04 MED ORDER — INDOCYANINE GREEN 25 MG IV SOLR
25.0000 mg | INTRAVENOUS | Status: DC | PRN
  Administered 2015-01-04: 25 mg

## 2015-01-04 MED ORDER — METOCLOPRAMIDE HCL 5 MG/ML IJ SOLN
INTRAMUSCULAR | Status: AC
  Filled 2015-01-04: qty 2

## 2015-01-04 MED ORDER — LIDOCAINE HCL (CARDIAC) 20 MG/ML IV SOLN
INTRAVENOUS | Status: DC | PRN
  Administered 2015-01-04: 50 mg via INTRAVENOUS

## 2015-01-04 MED ORDER — PROPOFOL 10 MG/ML IV BOLUS
INTRAVENOUS | Status: DC | PRN
  Administered 2015-01-04: 200 mg via INTRAVENOUS

## 2015-01-04 MED ORDER — CLINDAMYCIN PHOSPHATE 900 MG/50ML IV SOLN
INTRAVENOUS | Status: AC
  Filled 2015-01-04: qty 50

## 2015-01-04 MED ORDER — PHENYLEPHRINE 40 MCG/ML (10ML) SYRINGE FOR IV PUSH (FOR BLOOD PRESSURE SUPPORT)
PREFILLED_SYRINGE | INTRAVENOUS | Status: AC
  Filled 2015-01-04: qty 10

## 2015-01-04 MED ORDER — LACTATED RINGERS IV SOLN: INTRAVENOUS | Status: DC

## 2015-01-04 MED ORDER — EPHEDRINE SULFATE 50 MG/ML IJ SOLN
INTRAMUSCULAR | Status: DC | PRN
  Administered 2015-01-04: 10 mg via INTRAVENOUS
  Administered 2015-01-04: 5 mg via INTRAVENOUS

## 2015-01-04 MED ORDER — FENTANYL CITRATE (PF) 250 MCG/5ML IJ SOLN
INTRAMUSCULAR | Status: AC
  Filled 2015-01-04: qty 5

## 2015-01-04 MED ORDER — PROPOFOL 10 MG/ML IV BOLUS
INTRAVENOUS | Status: AC
  Filled 2015-01-04: qty 40

## 2015-01-04 MED ORDER — LACTATED RINGERS IV SOLN
INTRAVENOUS | Status: DC | PRN
  Administered 2015-01-04 (×2): via INTRAVENOUS

## 2015-01-04 MED ORDER — SULFAMETHOXAZOLE-TRIMETHOPRIM 800-160 MG PO TABS: 1.0000 | ORAL_TABLET | Freq: Two times a day (BID) | ORAL | Status: DC

## 2015-01-04 MED ORDER — DEXAMETHASONE SODIUM PHOSPHATE 10 MG/ML IJ SOLN
INTRAMUSCULAR | Status: DC | PRN
  Administered 2015-01-04: 10 mg via INTRAVENOUS

## 2015-01-04 MED ORDER — LISINOPRIL 10 MG PO TABS
10.0000 mg | ORAL_TABLET | Freq: Every day | ORAL | Status: DC
  Administered 2015-01-04 – 2015-01-05 (×2): 10 mg via ORAL
  Filled 2015-01-04 (×2): qty 1

## 2015-01-04 MED ORDER — EPHEDRINE SULFATE 50 MG/ML IJ SOLN
INTRAMUSCULAR | Status: AC
  Filled 2015-01-04: qty 1

## 2015-01-04 MED ORDER — HYDROCODONE-ACETAMINOPHEN 5-325 MG PO TABS: 1.0000 | ORAL_TABLET | Freq: Four times a day (QID) | ORAL | Status: DC | PRN

## 2015-01-04 MED ORDER — CEFAZOLIN SODIUM-DEXTROSE 2-3 GM-% IV SOLR
INTRAVENOUS | Status: AC
  Filled 2015-01-04: qty 50

## 2015-01-04 MED ORDER — HYDROMORPHONE HCL 1 MG/ML IJ SOLN
INTRAMUSCULAR | Status: DC | PRN
  Administered 2015-01-04 (×4): 0.5 mg via INTRAVENOUS

## 2015-01-04 MED ORDER — ROCURONIUM BROMIDE 100 MG/10ML IV SOLN
INTRAVENOUS | Status: DC | PRN
  Administered 2015-01-04 (×2): 5 mg via INTRAVENOUS
  Administered 2015-01-04 (×2): 15 mg via INTRAVENOUS
  Administered 2015-01-04: 10 mg via INTRAVENOUS
  Administered 2015-01-04: 40 mg via INTRAVENOUS
  Administered 2015-01-04: 20 mg via INTRAVENOUS

## 2015-01-04 MED ORDER — ROCURONIUM BROMIDE 100 MG/10ML IV SOLN
INTRAVENOUS | Status: AC
Start: 2015-01-04 — End: 2015-01-04
  Filled 2015-01-04: qty 1

## 2015-01-04 MED ORDER — METOCLOPRAMIDE HCL 5 MG/ML IJ SOLN
INTRAMUSCULAR | Status: DC | PRN
  Administered 2015-01-04: 10 mg via INTRAVENOUS

## 2015-01-04 MED ORDER — BUPIVACAINE LIPOSOME 1.3 % IJ SUSP
20.0000 mL | Freq: Once | INTRAMUSCULAR | Status: AC
  Administered 2015-01-04: 20 mL
  Filled 2015-01-04: qty 20

## 2015-01-04 MED ORDER — SODIUM CHLORIDE 0.9 % IJ SOLN
INTRAMUSCULAR | Status: AC
  Filled 2015-01-04: qty 20

## 2015-01-04 MED ORDER — SUCCINYLCHOLINE CHLORIDE 20 MG/ML IJ SOLN
INTRAMUSCULAR | Status: DC | PRN
  Administered 2015-01-04: 100 mg via INTRAVENOUS

## 2015-01-04 MED ORDER — ACETAMINOPHEN 500 MG PO TABS
1000.0000 mg | ORAL_TABLET | Freq: Four times a day (QID) | ORAL | Status: AC
  Administered 2015-01-04 – 2015-01-05 (×4): 1000 mg via ORAL
  Filled 2015-01-04 (×4): qty 2

## 2015-01-04 MED ORDER — LIDOCAINE HCL (CARDIAC) 20 MG/ML IV SOLN
INTRAVENOUS | Status: AC
  Filled 2015-01-04: qty 10

## 2015-01-04 MED ORDER — HYDROMORPHONE HCL 1 MG/ML IJ SOLN
0.5000 mg | INTRAMUSCULAR | Status: DC | PRN
  Administered 2015-01-04 (×2): 0.5 mg via INTRAVENOUS
  Administered 2015-01-04 (×2): 1 mg via INTRAVENOUS
  Administered 2015-01-05: 0.5 mg via INTRAVENOUS
  Administered 2015-01-05: 1 mg via INTRAVENOUS
  Filled 2015-01-04 (×6): qty 1

## 2015-01-04 MED ORDER — SUGAMMADEX SODIUM 500 MG/5ML IV SOLN
INTRAVENOUS | Status: AC
  Filled 2015-01-04: qty 5

## 2015-01-04 MED ORDER — STERILE WATER FOR IRRIGATION IR SOLN
Status: DC | PRN
  Administered 2015-01-04: 30 mL

## 2015-01-04 MED ORDER — CEFAZOLIN SODIUM-DEXTROSE 2-3 GM-% IV SOLR
2.0000 g | INTRAVENOUS | Status: AC
  Administered 2015-01-04: 2 g via INTRAVENOUS

## 2015-01-04 MED ORDER — GLYCOPYRROLATE 0.2 MG/ML IJ SOLN
INTRAMUSCULAR | Status: AC
Start: 2015-01-04 — End: 2015-01-04
  Filled 2015-01-04: qty 1

## 2015-01-04 MED ORDER — DEXTROSE-NACL 5-0.45 % IV SOLN
INTRAVENOUS | Status: DC
  Administered 2015-01-04 – 2015-01-05 (×3): via INTRAVENOUS

## 2015-01-04 MED ORDER — SODIUM CHLORIDE 0.9 % IJ SOLN
INTRAMUSCULAR | Status: DC | PRN
  Administered 2015-01-04: 20 mL

## 2015-01-04 MED ORDER — FENTANYL CITRATE (PF) 100 MCG/2ML IJ SOLN
INTRAMUSCULAR | Status: DC | PRN
  Administered 2015-01-04 (×2): 50 ug via INTRAVENOUS
  Administered 2015-01-04: 100 ug via INTRAVENOUS
  Administered 2015-01-04: 50 ug via INTRAVENOUS

## 2015-01-04 MED ORDER — PHENYLEPHRINE HCL 10 MG/ML IJ SOLN
INTRAMUSCULAR | Status: DC | PRN
  Administered 2015-01-04 (×2): 80 ug via INTRAVENOUS

## 2015-01-04 MED ORDER — HYDROMORPHONE HCL 2 MG/ML IJ SOLN
INTRAMUSCULAR | Status: AC
  Filled 2015-01-04: qty 1

## 2015-01-04 MED ORDER — ONDANSETRON HCL 4 MG/2ML IJ SOLN
INTRAMUSCULAR | Status: DC | PRN
  Administered 2015-01-04: 4 mg via INTRAVENOUS

## 2015-01-04 SURGICAL SUPPLY — 55 items
CABLE HIGH FREQUENCY MONO STRZ (ELECTRODE) ×4 IMPLANT
CATH FOLEY 2WAY SLVR 18FR 30CC (CATHETERS) ×4 IMPLANT
CATH TIEMANN FOLEY 18FR 5CC (CATHETERS) ×4 IMPLANT
CHLORAPREP W/TINT 26ML (MISCELLANEOUS) ×4 IMPLANT
CLIP LIGATING HEM O LOK PURPLE (MISCELLANEOUS) ×24 IMPLANT
CLOTH BEACON ORANGE TIMEOUT ST (SAFETY) ×4 IMPLANT
CONT SPEC 4OZ CLIKSEAL STRL BL (MISCELLANEOUS) ×4 IMPLANT
COVER SURGICAL LIGHT HANDLE (MISCELLANEOUS) ×4 IMPLANT
COVER TIP SHEARS 8 DVNC (MISCELLANEOUS) ×2 IMPLANT
COVER TIP SHEARS 8MM DA VINCI (MISCELLANEOUS) ×2
CUTTER ECHEON FLEX ENDO 45 340 (ENDOMECHANICALS) ×4 IMPLANT
DECANTER SPIKE VIAL GLASS SM (MISCELLANEOUS) ×4 IMPLANT
DRSG TEGADERM 4X4.75 (GAUZE/BANDAGES/DRESSINGS) ×12 IMPLANT
DRSG TEGADERM 6X8 (GAUZE/BANDAGES/DRESSINGS) ×8 IMPLANT
ELECT REM PT RETURN 9FT ADLT (ELECTROSURGICAL) ×4
ELECTRODE REM PT RTRN 9FT ADLT (ELECTROSURGICAL) ×2 IMPLANT
GAUZE SPONGE 2X2 8PLY STRL LF (GAUZE/BANDAGES/DRESSINGS) ×2 IMPLANT
GLOVE BIO SURGEON STRL SZ 6.5 (GLOVE) IMPLANT
GLOVE BIO SURGEONS STRL SZ 6.5 (GLOVE)
GLOVE BIOGEL M STRL SZ7.5 (GLOVE) ×76 IMPLANT
GLOVE BIOGEL PI IND STRL 7.5 (GLOVE) IMPLANT
GLOVE BIOGEL PI INDICATOR 7.5 (GLOVE)
GOWN STRL REUS W/TWL LRG LVL3 (GOWN DISPOSABLE) ×36 IMPLANT
GOWN STRL REUS W/TWL LRG LVL4 (GOWN DISPOSABLE) IMPLANT
HOLDER FOLEY CATH W/STRAP (MISCELLANEOUS) ×4 IMPLANT
IV LACTATED RINGERS 1000ML (IV SOLUTION) ×4 IMPLANT
KIT ACCESSORY DA VINCI DISP (KITS) ×2
KIT ACCESSORY DVNC DISP (KITS) ×2 IMPLANT
KIT PROCEDURE DA VINCI SI (MISCELLANEOUS) ×2
KIT PROCEDURE DVNC SI (MISCELLANEOUS) ×2 IMPLANT
LIQUID BAND (GAUZE/BANDAGES/DRESSINGS) ×4 IMPLANT
NEEDLE INSUFFLATION 14GA 120MM (NEEDLE) ×4 IMPLANT
NEEDLE SPNL 22GX7 QUINCKE BK (NEEDLE) ×4 IMPLANT
PACK ROBOT UROLOGY CUSTOM (CUSTOM PROCEDURE TRAY) ×4 IMPLANT
PAD POSITIONING PINK XL (MISCELLANEOUS) ×4 IMPLANT
RELOAD GREEN ECHELON 45 (STAPLE) ×4 IMPLANT
SET TUBE IRRIG SUCTION NO TIP (IRRIGATION / IRRIGATOR) ×4 IMPLANT
SHEET LAVH (DRAPES) IMPLANT
SLEEVE SURGEON STRL (DRAPES) ×4 IMPLANT
SOLUTION ELECTROLUBE (MISCELLANEOUS) ×4 IMPLANT
SPONGE GAUZE 2X2 STER 10/PKG (GAUZE/BANDAGES/DRESSINGS) ×2
SPONGE LAP 4X18 X RAY DECT (DISPOSABLE) ×4 IMPLANT
SUT ETHILON 3 0 PS 1 (SUTURE) ×4 IMPLANT
SUT MNCRL AB 4-0 PS2 18 (SUTURE) ×8 IMPLANT
SUT PDS AB 1 CT1 27 (SUTURE) ×8 IMPLANT
SUT VIC AB 2-0 SH 27 (SUTURE) ×2
SUT VIC AB 2-0 SH 27X BRD (SUTURE) ×2 IMPLANT
SUT VICRYL 0 UR6 27IN ABS (SUTURE) ×4 IMPLANT
SUT VLOC BARB 180 ABS3/0GR12 (SUTURE) ×12
SUTURE VLOC BRB 180 ABS3/0GR12 (SUTURE) ×6 IMPLANT
SYR 27GX1/2 1ML LL SAFETY (SYRINGE) ×4 IMPLANT
TOWEL OR 17X26 10 PK STRL BLUE (TOWEL DISPOSABLE) ×4 IMPLANT
TOWEL OR NON WOVEN STRL DISP B (DISPOSABLE) ×4 IMPLANT
TROCAR 12M 150ML BLUNT (TROCAR) ×4 IMPLANT
WATER STERILE IRR 1500ML POUR (IV SOLUTION) ×4 IMPLANT

## 2015-01-04 NOTE — Progress Notes (Signed)
Utilization review completed.  

## 2015-01-04 NOTE — Brief Op Note (Signed)
01/04/2015  12:32 PM  PATIENT:  [name]  69 y.o. male  PRE-OPERATIVE DIAGNOSIS:  MODERATE RISK PROSTATE CANCER  POST-OPERATIVE DIAGNOSIS:  MODERATE RISK PROSTATE CANCER  PROCEDURE:  Procedure(s): ROBOTIC ASSISTED LAPAROSCOPIC RADICAL PROSTATECTOMY WITH INDOCYANINE GREEN DYE (N/A) BILATERAL LYMPHADENECTOMY (Bilateral)  SURGEON:  Surgeon(s) and Role:    * Alexis Frock, MD - Primary  PHYSICIAN ASSISTANT:   ASSISTANTS: Debbrah Alar PA   ANESTHESIA:   local and general  EBL:  Total I/O In: 1500 [I.V.:1500] Out: 300 [Urine:150; Blood:150]  BLOOD ADMINISTERED:none  DRAINS: 1 - JP to bulb   LOCAL MEDICATIONS USED:  MARCAINE     SPECIMEN:  Source of Specimen:  prostatectomy, pelvic lymph nodes, periprostatic fat  DISPOSITION OF SPECIMEN:  PATHOLOGY  COUNTS:  YES  TOURNIQUET:  * No tourniquets in log *  DICTATION: .Other Dictation: Dictation Number  [id number]  PLAN OF CARE: Admit to inpatient   PATIENT DISPOSITION:  PACU - hemodynamically stable.   Delay start of Pharmacological VTE agent (>24hrs) due to surgical blood loss or risk of bleeding: yes

## 2015-01-04 NOTE — Anesthesia Postprocedure Evaluation (Signed)
Anesthesia Post Note  Patient: [name]  Procedure(s) Performed: Procedure(s) (LRB): ROBOTIC ASSISTED LAPAROSCOPIC RADICAL PROSTATECTOMY WITH INDOCYANINE GREEN DYE (N/A) BILATERAL LYMPHADENECTOMY (Bilateral)  Patient location during evaluation: PACU Anesthesia Type: General Level of consciousness: sedated, oriented, patient cooperative and responds to stimulation Pain management: pain level controlled Vital Signs Assessment: post-procedure vital signs reviewed and stable Respiratory status: spontaneous breathing, nonlabored ventilation, respiratory function stable and patient connected to nasal cannula oxygen Cardiovascular status: blood pressure returned to baseline and stable Postop Assessment: no signs of nausea or vomiting Anesthetic complications: no    Last Vitals:  Filed Vitals:   01/04/15 1330 01/04/15 1347  BP:  133/75  Pulse:  79  Temp:  36.8 C  Resp: 12 12    Last Pain:  Filed Vitals:   01/04/15 1758  PainSc: Asleep                 [name],E. Letita Prentiss

## 2015-01-04 NOTE — H&P (Signed)
[name] is an 69 y.o. male.    Chief Complaint: Pre-Op Prostatectomy  HPI:   1 - Moderate Risk Prostate Cancer -  progressed from initial very low risk on surveillance 07/2013 - Gleason 6 5% RMA on eval PSA 5.9 ==> Active surveillance 07/2014 - Gleason 4+3=7 10% RMA on surveillance biopsy, PSA 4.7. Prostate 54m, no median lobe.   PMH sig for HTN, HLD, TNA. NO CV disease. NO blood thinners. His PCP is NMertha FindersMD. He is avid hRetail banker   Today " [name]" is seen to proceed with prostatectomy with bilateral lymphadenectomy.   Past Medical History  Diagnosis Date  . [place]  . Esophageal reflux   . Hypertension     Past Surgical History  Procedure Laterality Date  . Prostate biopsy  08/23/2013  . Prostate biopsy  08/15/14  . Tonsillectomy      Family History  Problem Relation Age of Onset  . Breast cancer Mother   . Stroke Father    Social History:  reports that he has never smoked. He does not have any smokeless tobacco history on file. He reports that he drinks alcohol. He reports that he does not use illicit drugs.  Allergies: No Known Allergies  Medications Prior to Admission  Medication Sig Dispense Refill  . aspirin 81 MG tablet Take 81 mg by mouth daily.    . cholecalciferol (VITAMIN D) 1000 UNITS tablet Take 1,000 Units by mouth daily.    . DENTA 5000 PLUS 1.1 % CREA dental cream APPLY A SMALL AMOUNT ONTO TEETH BEFORE BEDTIME, DONT RINSE AFTER APPLYING  5  . lisinopril (PRINIVIL,ZESTRIL) 20 MG tablet Take 10 mg by mouth daily.     . Omega-3 Fatty Acids (FISH OIL) 1000 MG CAPS Take 3,000 mg by mouth daily.    .Marland Kitchenomeprazole (PRILOSEC OTC) 20 MG tablet Take 20 mg by mouth every other day.     . rosuvastatin (CRESTOR) 40 MG tablet Take 20 mg by mouth daily.     . sildenafil (VIAGRA) 100 MG tablet Take 100 mg by mouth daily as needed for erectile dysfunction.      Results for orders placed or performed during the hospital encounter of 01/02/15  (from the past 48 hour(s))  CBC     Status: None   Collection Time: 01/02/15 10:40 AM  Result Value Ref Range   WBC 5.3 4.0 - 10.5 K/uL   RBC 4.77 4.22 - 5.81 MIL/uL   Hemoglobin 14.5 13.0 - 17.0 g/dL   HCT 43.2 39.0 - 52.0 %   MCV 90.6 78.0 - 100.0 fL   MCH 30.4 26.0 - 34.0 pg   MCHC 33.6 30.0 - 36.0 g/dL   RDW 12.5 11.5 - 15.5 %   Platelets 210 150 - 400 K/uL  Basic metabolic panel     Status: Abnormal   Collection Time: 01/02/15 10:40 AM  Result Value Ref Range   Sodium 139 135 - 145 mmol/L   Potassium 5.2 (H) 3.5 - 5.1 mmol/L   Chloride 105 101 - 111 mmol/L   CO2 27 22 - 32 mmol/L   Glucose, Bld 102 (H) 65 - 99 mg/dL   BUN 17 6 - 20 mg/dL   Creatinine, Ser 0.93 0.61 - 1.24 mg/dL   Calcium 9.1 8.9 - 10.3 mg/dL   GFR calc non Af Amer >60 >60 mL/min   GFR calc Af Amer >60 >60 mL/min    Comment: (NOTE) The eGFR has been  calculated using the CKD EPI equation. This calculation has not been validated in all clinical situations. eGFR's persistently <60 mL/min signify possible Chronic Kidney Disease.    Anion gap 7 5 - 15  Type and screen All Cardiac and thoracic surgeries, spinal fusions, myomectomies, craniotomies, colon & liver resections, total joint revisions, same day c-section with placenta previa or accreta.     Status: None   Collection Time: 01/02/15 10:40 AM  Result Value Ref Range   ABO/RH(D) O POS    Antibody Screen NEG    Sample Expiration 01/16/2015    Extend sample reason NO TRANSFUSIONS OR PREGNANCY IN THE PAST 3 MONTHS   ABO/Rh     Status: None   Collection Time: 01/02/15 10:40 AM  Result Value Ref Range   ABO/RH(D) O POS    No results found.  Review of Systems  Constitutional: Negative.  Negative for fever.  HENT: Negative.   Eyes: Negative.   Respiratory: Negative.   Cardiovascular: Negative.   Gastrointestinal: Negative.   Genitourinary: Negative.   Musculoskeletal: Negative.   Skin: Negative.   Neurological: Negative.   Endo/Heme/Allergies:  Negative.   Psychiatric/Behavioral: Negative.     Blood pressure 137/83, pulse 58, temperature 98.5 F (36.9 C), temperature source Oral, resp. rate 18, SpO2 99 %. Physical Exam  Constitutional: He is oriented to person, place, and time. He appears well-developed.  HENT:  Head: Normocephalic.  Eyes: Pupils are equal, round, and reactive to light.  Neck: Normal range of motion.  Cardiovascular: Normal rate.   Respiratory: Effort normal.  GI: Soft.  Genitourinary: Penis normal.  No CVAT  Musculoskeletal: Normal range of motion.  Neurological: He is alert and oriented to person, place, and time.  Skin: Skin is warm.  Psychiatric: He has a normal mood and affect. His behavior is normal. Judgment and thought content normal.     Assessment/Plan  1 - Moderate Risk Prostate Cancer -    Were discussed prostatectomy and specifically robotic prostatectomy with bilateral pelvic lymphadenectomy being the technique that I most commonly perform. I showed the patient on their abdomen the approximately 6 small incision (trocar) sites as well as presumed extraction sites with robotic approach as well as possible open incision sites should open conversion be necessary. We rediscussed peri-operative risks including bleeding, infection, deep vein thrombosis, pulmonary embolism, compartment syndrome, nuropathy / neuropraxia, heart attack, stroke, death, as well as long-term risks such as non-cure / need for additional therapy. We specifically readdressed that the procedure would compromise urinary control leading to stress incontinence which typically resolves with time and pelvic rehabilitation (Kegel's, etc..), but can sometimes be permanent and require additional therapy including surgery. We also specifically readdressed sexual sequellae including significant erectile dysfunction which typically partially resolves with time but can also be permanent and require additional therapy including surgery.   We  rediscussed the typical hospital course including usual 1-2 night hospitalization, discharge with foley catheter in place usually for 1-2 weeks before voiding trial as well as usually 2 week recovery until able to perform most non-strenuous activity and 6 weeks until able to return to most jobs and more strenuous activity such as exercise.   He voiced understanding and desire to proceed today as planned.   Emberlie Gotcher 01/04/2015, 6:33 AM

## 2015-01-04 NOTE — Discharge Instructions (Signed)
1. Activity:  You are encouraged to ambulate frequently (about every hour during waking hours) to help prevent blood clots from forming in your legs or lungs.  However, you should not engage in any heavy lifting (> 10-15 lbs), strenuous activity, or straining. 2. Diet: You should continue a clear liquid diet until passing gas from below.  Once this occurs, you may advance your diet to a soft diet that would be easy to digest (i.e soups, scrambled eggs, mashed potatoes, etc.) for 24 hours just as you would if getting over a bad stomach flu.  If tolerating this diet well for 24 hours, you may then begin eating regular food.  It will be normal to have some amount of bloating, nausea, and abdominal discomfort intermittently. 3. Prescriptions:  You will be provided a prescription for pain medication to take as needed.  If your pain is not severe enough to require the prescription pain medication, you may take Tylenol instead.  You should also take an over the counter stool softener (Colace 100 mg twice daily) to avoid straining with bowel movements as the pain medication may constipate you. Finally, you will also be provided a prescription for an antibiotic to begin the day prior to your return visit in the office for catheter removal. 4. Catheter care: You will be taught how to take care of the catheter by the nursing staff prior to discharge from the hospital.  You may use both a leg bag and the larger bedside bag but it is recommended to at least use the bigger bedside bag at nighttime as the leg bag is small and will fill up overnight and also does not drain as well when lying flat. You may periodically feel a strong urge to void with the catheter in place.  This is a bladder spasm and most often can occur when having a bowel movement or when you are moving around. It is typically self-limited and usually will stop after a few minutes.  You may use some Vaseline or Neosporin around the tip of the catheter to  reduce friction at the tip of the penis. 5. Incisions: You may remove your dressing bandages the 2nd day after surgery.  You most likely will have a few small staples in each of the incisions and once the bandages are removed, the incisions may stay open to air.  You may start showering (not soaking or bathing in water) 48 hours after surgery and the incisions simply need to be patted dry after the shower.  No additional care is needed. 6. What to call us about: You should call the office [phone]) if you develop fever > 101, persistent vomiting, or the catheter stops draining. Also, feel free to call with any other questions you may have and remember the handout that was provided to you as a reference preoperatively which answers many of the common questions that arise after surgery. 7. You may resume aspirin, advil, aleve, vitamins, and supplements 7 days after surgery.  Do not resume Viagra until advised to do so by Dr. Tresa Moore.

## 2015-01-04 NOTE — Transfer of Care (Signed)
Immediate Anesthesia Transfer of Care Note  Patient: [name]  Procedure(s) Performed: Procedure(s): ROBOTIC ASSISTED LAPAROSCOPIC RADICAL PROSTATECTOMY WITH INDOCYANINE GREEN DYE (N/A) BILATERAL LYMPHADENECTOMY (Bilateral)  Patient Location: PACU  Anesthesia Type:General  Level of Consciousness:  sedated, patient cooperative and responds to stimulation  Airway & Oxygen Therapy:Patient Spontanous Breathing and Patient connected to face mask oxgen  Post-op Assessment:  Report given to PACU RN and Post -op Vital signs reviewed and stable  Post vital signs:  Reviewed and stable  Last Vitals:  Filed Vitals:   01/04/15 0606  BP: 137/83  Pulse: 58  Temp: 36.9 C  Resp: 18    Complications: No apparent anesthesia complications

## 2015-01-04 NOTE — Anesthesia Procedure Notes (Signed)
Procedure Name: Intubation Date/Time: 01/04/2015 8:33 AM Performed by: Carlye Panameno, Virgel Gess Pre-anesthesia Checklist: Patient identified, Emergency Drugs available, Suction available, Patient being monitored and Timeout performed Patient Re-evaluated:Patient Re-evaluated prior to inductionOxygen Delivery Method: Circle system utilized Preoxygenation: Pre-oxygenation with 100% oxygen Intubation Type: IV induction Ventilation: Mask ventilation without difficulty Laryngoscope Size: Mac and 4 Grade View: Grade III Tube type: Oral Tube size: 7.5 mm Number of attempts: 2 Airway Equipment and Method: Stylet Placement Confirmation: ETT inserted through vocal cords under direct vision,  positive ETCO2,  CO2 detector and breath sounds checked- equal and bilateral Secured at: 23 cm Tube secured with: Tape Dental Injury: Teeth and Oropharynx as per pre-operative assessment  Difficulty Due To: Difficult Airway- due to anterior larynx, Difficult Airway- due to limited oral opening and Difficult Airway- due to dentition Comments: Easy mask.  G2 view with Glidescope.

## 2015-01-04 NOTE — Progress Notes (Signed)
Day of Surgery   Subjective: Patient reports pain is well controlled. No N/V, flatus or BMs. Has not ambulated.  Objective: Vital signs in last 24 hours: Temp:  [98 F (36.7 C)-98.5 F (36.9 C)] 98.2 F (36.8 C) (12/07 1347) Pulse Rate:  [58-88] 79 (12/07 1347) Resp:  [11-18] 12 (12/07 1347) BP: (119-146)/(75-85) 133/75 mmHg (12/07 1347) SpO2:  [93 %-99 %] 96 % (12/07 1347) Weight:  [94.802 kg (209 lb)] 94.802 kg (209 lb) (12/07 [id number])  Intake/Output from previous day:   Intake/Output this shift: Total I/O In: 1600 [I.V.:1600] Out: 565 [Urine:325; Drains:90; Blood:150]  Physical Exam:  General:alert, cooperative and appears stated age GI: soft, non-distended, appropriately TTP, incisions C/D/I. JP with SS output.  Male genitalia: Foley with thin light pink urine without clots in bag. Extremities: extremities normal, atraumatic, no cyanosis or edema  Lab Results:  Recent Labs  01/02/15 1040 01/04/15 1305  HGB 14.5 13.4  HCT 43.2 41.1   BMET  Recent Labs  01/02/15 1040  NA 139  K 5.2*  CL 105  CO2 27  GLUCOSE 102*  BUN 17  CREATININE 0.93  CALCIUM 9.1   No results for input(s): LABPT, INR in the last 72 hours. No results for input(s): LABURIN in the last 72 hours. No results found for this or any previous visit.  Studies/Results: No results found.  Assessment/Plan: Day of Surgery Procedure(s) (LRB): ROBOTIC ASSISTED LAPAROSCOPIC RADICAL PROSTATECTOMY WITH INDOCYANINE GREEN DYE (N/A) BILATERAL LYMPHADENECTOMY (Bilateral)  69 yo M POD#1 RALP with BPLND  - Clears - IVF - Hgb & BMP in AM - SCDs - Ambulate - Foley to straight drain - F/u JP output - F/u UOP - Possible D/C tomorrow vs Friday   LOS: 0 days   Acie Fredrickson 01/04/2015, 4:02 PM   I have seen and examined the patient and agree with above plan.  Doing well POD 0.

## 2015-01-05 LAB — BASIC METABOLIC PANEL
Anion gap: 4 — ABNORMAL LOW (ref 5–15)
BUN: 18 mg/dL (ref 6–20)
CALCIUM: 8.1 mg/dL — AB (ref 8.9–10.3)
CO2: 26 mmol/L (ref 22–32)
Chloride: 104 mmol/L (ref 101–111)
Creatinine, Ser: 0.92 mg/dL (ref 0.61–1.24)
GFR calc Af Amer: >60 mL/min (ref >60)
GLUCOSE: 141 mg/dL — AB (ref 65–99)
Potassium: 4.5 mmol/L (ref 3.5–5.1)
Sodium: 134 mmol/L — ABNORMAL LOW (ref 135–145)

## 2015-01-05 LAB — CREATININE, FLUID (PLEURAL, PERITONEAL, JP DRAINAGE): Creat, Fluid: 0.9 mg/dL

## 2015-01-05 LAB — HEMOGLOBIN AND HEMATOCRIT, BLOOD
HCT: 35.9 % — ABNORMAL LOW (ref 39.0–52.0)
Hemoglobin: 11.5 g/dL — ABNORMAL LOW (ref 13.0–17.0)

## 2015-01-05 MED ORDER — SENNOSIDES-DOCUSATE SODIUM 8.6-50 MG PO TABS: 1.0000 | ORAL_TABLET | Freq: Two times a day (BID) | ORAL | Status: DC

## 2015-01-05 NOTE — Discharge Summary (Signed)
Physician Discharge Summary  Patient ID: [name] MRN: [id number] DOB/AGE: 05-Mar-1945 69 y.o.  Admit date: 01/04/2015 Discharge date: 01/05/2015  Admission Diagnoses: Prostate Cancer  Discharge Diagnoses:  Active Problems:   Prostate cancer [facility])   Discharged Condition: good  Hospital Course:   1 - Prostate Cancer - pt underwent robotic assisted prostatectomy with bilateral pelvic lymphadenectomy on 01/04/15, the day of admission, without acute complications. He was admitted to the 4th floor Urology service post-op where he began his vigorous recovery. By POD 1, the day of discharge, he is ambulatory, pain conrolled with PO meds, tollerating PO intake JP removed as output minimal and Cr same as serum, and felt adequate for discharge.  Consults: None  Significant Diagnostic Studies: labs: as per above, surgical pathology pending.   Treatments: surgery:  robotic assisted prostatectomy with bilateral pelvic lymphadenectomy on 01/04/15  Discharge Exam: Blood pressure 104/44, pulse 51, temperature 97.7 F (36.5 C), temperature source Oral, resp. rate 14, height 6\' 1"  (1.854 m), weight 94.802 kg (209 lb), SpO2 97 %. General appearance: alert, cooperative and appears stated age Head: Normocephalic, without obvious abnormality, atraumatic Eyes: negative Nose: Nares normal. Septum midline. Mucosa normal. No drainage or sinus tenderness. Throat: lips, mucosa, and tongue normal; teeth and gums normal Neck: supple, symmetrical, trachea midline Back: symmetric, no curvature. ROM normal. No CVA tenderness. Resp: non-labored on room air Cardio: Nl rate GI: soft, non-tender; bowel sounds normal; no masses,  no organomegaly Male genitalia: normal, foley c/d/i with clear urien.  Pelvic: external genitalia normal Extremities: extremities normal, atraumatic, no cyanosis or edema Pulses: 2+ and symmetric Skin: Skin color, texture, turgor normal. No rashes or lesions Lymph nodes: Cervical,  supraclavicular, and axillary nodes normal. Neurologic: Grossly normal Incision/Wound: recent port sites / extraction sites c/d/i. JP removed and dry dressing applied.   Disposition: Final discharge disposition not confirmed     Medication List    STOP taking these medications        aspirin 81 MG tablet     cholecalciferol 1000 UNITS tablet  Commonly known as:  VITAMIN D     DENTA 5000 PLUS 1.1 % Crea dental cream  Generic drug:  sodium fluoride     Fish Oil 1000 MG Caps     sildenafil 100 MG tablet  Commonly known as:  VIAGRA      TAKE these medications        HYDROcodone-acetaminophen 5-325 MG tablet  Commonly known as:  NORCO  Take 1-2 tablets by mouth every 6 (six) hours as needed.     lisinopril 20 MG tablet  Commonly known as:  PRINIVIL,ZESTRIL  Take 10 mg by mouth daily.     omeprazole 20 MG tablet  Commonly known as:  PRILOSEC OTC  Take 20 mg by mouth every other day.     rosuvastatin 40 MG tablet  Commonly known as:  CRESTOR  Take 20 mg by mouth daily.     senna-docusate 8.6-50 MG tablet  Commonly known as:  Senokot-S  Take 1 tablet by mouth 2 (two) times daily. While taking pain meds to prevent constipation     sulfamethoxazole-trimethoprim 800-160 MG tablet  Commonly known as:  BACTRIM DS,SEPTRA DS  Take 1 tablet by mouth 2 (two) times daily. Start the day prior to foley removal appointment           Follow-up Information    Follow up with Alexis Frock, MD On 01/12/2015.   Specialty:  Urology   Why:  at 8:30AM for MD visit and catheter removal. Dr. Tresa Moore will call you with pathology results when available.    Contact information:   [place] [address] [phone]       Signed: Alexis Frock 01/05/2015, 2:32 PM

## 2015-01-05 NOTE — Op Note (Signed)
[name], [name] NO.:  [id number]  MEDICAL RECORD NO.:  [id number]  LOCATION:  [id number]                         FACILITY:  [facility]  PHYSICIAN:  Alexis Frock, MD     DATE OF BIRTH:  23-Jan-1946  DATE OF PROCEDURE: 01/04/2015                              OPERATIVE REPORT  DIAGNOSIS:  Moderate-risk prostate cancer.  PROCEDURE: 1. Robotic-assisted laparoscopic radical prostatectomy. 2. Injection of indocyanine green dye for sentinel lymphangiography. 3. Bilateral pelvic lymphadenectomy.  ASSISTANT:  Debbrah Alar, PA  ESTIMATED BLOOD LOSS:  150 mL.  COMPLICATIONS:  None.  SPECIMENS: 1. Periprostatic fat. 2. Radical prostatectomy. 3. Right external iliac lymph node, sentinel. 4. Right obturator lymph nodes. 5. Left external iliac lymph nodes. 6. Left obturator lymph nodes.  FINDINGS: 1. Hyperfluorescent lymph node with lymphatic channel coursing to and     from this in the right external iliac group, this was labeled as     such. 2. Significant desmoplastic reaction in the posterior plane along the     Denonvilliers fascia consistent with multiple prior biopsies.  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  He has elected surveillance of this.  He was found on workup of rising PSA and per protocol, biopsy to have increase in greatest tumor progressing from Gleason 6 to Gleason 7, moderate risk disease, but still low volume. Options were discussed for further management including continued surveillance protocol versus ablative therapies versus surgical extirpation with and without minimally invasive assistance, and the patient adamantly wished to proceed with prostatectomy.  Informed consent was obtained and placed in the medical record.  PROCEDURE IN DETAIL:  The patient being [name], was verified. Procedure being robotic prostatectomy was confirmed.   Procedure was carried out.  Time-out was performed.  Intravenous antibiotics were administered.  General endotracheal anesthesia was introduced.  The patient was placed into a supine position tucking his arms at his side. Foley catheter was placed per urethra to straight drain after prepping and draping the patient's infra-xiphoid abdomen using chlorhexidine gluconate in his penis, perineum and proximal thighs using iodine.  A test of steep Trendelenburg positioning was performed and he was found to be suitably positioned.  Next, a high-flow, low-pressure pneumoperitoneum was obtained using Veress technique in the infraumbilical midline having passed the aspiration and drop test.  An 8- mm robotic camera port was placed into this location.  Laparoscopic examination of the peritoneal cavity revealed no significant adhesions, no visceral injury.  Additional ports were then placed as follows: Right paramedian 8-mm robotic port, right far lateral 12-mm assistant port, right paramedian 5-mm suction port, left paramedian 8-mm robotic port, left far lateral 8-mm robotic port.  Robot was docked and passed through the electronic checks.  Initial attention was directed at development of space of Retzius.  Incision was made lateral to the left medial umbilical ligament from the midline towards the area of the internal ring and then coursing along the iliac vessels towards the area of the ureter.  The vas deferens was encountered, purposely ligated and uses the bucket-handle for gentle medial retraction and the  lateral aspect of the bladder was swept away from the pelvic sidewall in the base-to-apex orientation.  This exposed the endopelvic fascia on the left side.  A mirror-imaged dissection was performed on the right and anterior attachments were taken down using cautery scissors exposing the anterior base of the prostate, which was defatted with this tissue, set aside, labeled periprostatic fat.   This better allowed visualization of the bladder neck and prostate junction.  At this point, 0.2 mL of indocyanine green dye was injected into each lobe of the prostate using a percutaneously placed robotically-guided spinal needle taking great care to avoid dye spillage, which did not occur.  Next, the endopelvic fascia was carefully swept away from the lateral aspect of the prostate in a base-to-apex orientation.  This exposed the dorsal venous complex, which was carefully controlled using vascular load stapler.  It had been approximately 15 minutes post-dye injection and then attention was directed at sentinel lymphangiography.  Inspection of the pelvis under near-infrared fluorescence light revealed several lymphatic channels coursing along the area of the external iliac lymph node field and several hyperfluorescent lymph nodes within the external iliac, there were no additional hyperfluorescent lymph nodes within the pelvis.  As since lymphadenectomy was performed using standard template, first at the left external iliac lymph node groups with the confines being left external iliac artery, vein, pelvic sidewall and iliac bifurcation.  Lymphostasis was achieved with cold clips.  This contained all hyperfluorescent lymph node materials labeled as such.  Next, the left obturator group was carefully mobilized with confines being external iliac vein, obturator nerve and pelvic side wall.  Lymphostasis was achieved with cold clips. This was set aside and labeled as such.  A mirror-imaged lymphadenectomy was performed on the contralateral side; however, no fluorescent nodes were noted.  The obturator nerves were inspected following these maneuvers and found to be visibly intact.    Attention was then directed at bladder neck dissection.  The bladder neck was identified removing the Foley catheter back and forth and a lateral release approach was taken releasing the lateral aspect of the  bladder neck on each side.  Then, anterior- posterior release was performed separating the bladder neck from the base of the prostate keeping what appeared to be a rim of circular bladder neck fibers at each side of the specimen.  Posterior dissection was performed by incising approximately 8 mm inferior-posteriorly to the posterior lip of the bladder neck and entering the plane of Denonvilliers.  Bilateral vas deferenses were encountered, dissected for distance approximately 2 cm and placed on gentle superior traction.  The plan was quite desmoplastic, consistent with likely prior biopsies. Seminal vesicles were dissected to their tip and also placed on gentle superior traction.  Dissection proceeded within this plane towards the area of the apex of the prostate.  This exposed the vascular pedicles on both sides.  The left pedicle was first controlled using the sequential clipping technique towards the area of the mid aspect of the prostate, at which point, a neurovascular release was very carefully performed performing aggressive nerve sparing on the left.  The right-sided pedicles were controlled, then partial nerve-sparing performed on the right giving his right-sided disease.  Apical dissection was performed on the anterior plane by placing the prostate in superior traction and transecting the membranous urethra coldly keeping what appeared to be an adequate membranous urethral stump.  This completely freed up the prostatectomy specimen, which was placed into an EndoCatch bag for later retrieval.  Distal rectal exam was then performed using indicator glove and no evidence of rectal violation was noted and there was no evidence of blood on the glove.  Posterior reconstruction was performed using 3-0 V-Loc suture reapproximating the posterior urethral plate to the bladder neck bringing these structures in the tension-free apposition.  Next, mucosa-to-mucosa anastomosis was performed  using double-armed V-Loc suture from the 6 o'clock to 12 o'clock position.  Anterior urethropexy stitch was also placed in the puboprostatic ligaments.  New Foley catheter was placed per urethra, which irrigated quantitatively.  At this point, all sponge and needle counts were correct.  Hemostasis appeared excellent.  A closed suction drain was brought through the previous left lateral most robotic port site to the area of the peritoneal cavity, positioning it purposely away from the area of anastomosis.  Robot was then undocked.  The right lateral most 12-mm port site was closed at the level of the fascia using Carter-Thomason suture passer and Vicryl suture.  Specimen was retrieved by extending the previous camera port site inferiorly for total distance approximately 4 cm, removing the prostatectomy specimens, setting aside for permanent pathology.  Extraction site was closed at the level of the fascia using figure-of-eight PDS x4.  All incision sites were infiltrated with dilute lyophilized Marcaine and closed at the level of the skin using subcuticular Monocryl followed by Dermabond.  Drain stitch was also applied on the extraction site.  Scarpa's was also reapproximated using running Vicryl and procedure was terminated.  The patient tolerated the procedure well.  There were no immediate periprocedural complications.  The patient was taken to the postanesthesia care unit in stable condition.          ______________________________ Alexis Frock, MD     TM/MEDQ  D:  01/04/2015  T:  01/05/2015  Job:  [id number]

## 2015-01-05 NOTE — Progress Notes (Signed)
1 Day Post-Op Subjective: Patient reports NAE O/N. Denies N/V. Passed small amount of flatus this AM. No BM. Does report some burping O/N which has improved. No hiccuping or distension.  Objective: Vital signs in last 24 hours: Temp:  [97.7 F (36.5 C)-98.4 F (36.9 C)] 98.2 F (36.8 C) (12/08 0627) Pulse Rate:  [55-88] 55 (12/08 0627) Resp:  [11-14] 14 (12/08 0627) BP: (82-146)/(42-85) 99/61 mmHg (12/08 0627) SpO2:  [93 %-99 %] 95 % (12/08 0627)  Intake/Output from previous day: 12/07 0701 - 12/08 0700 In: 4073.8 [P.O.:480; I.V.:3593.8] Out: 2325 [Urine:1975; Drains:200; Blood:150] Intake/Output this shift:    Physical Exam:  General:alert, cooperative and appears stated age GI: soft, ND, appropriately TTP, incisions C/D/I, JP with SS output  Male genitalia: Foley with clear, yellow urine Extremities: extremities normal, atraumatic, no cyanosis or edema  Lab Results:  Recent Labs  01/02/15 1040 01/04/15 1305 01/05/15 0424  HGB 14.5 13.4 11.5*  HCT 43.2 41.1 35.9*   BMET  Recent Labs  01/02/15 1040 01/05/15 0424  NA 139 134*  K 5.2* 4.5  CL 105 104  CO2 27 26  GLUCOSE 102* 141*  BUN 17 18  CREATININE 0.93 0.92  CALCIUM 9.1 8.1*   No results for input(s): LABPT, INR in the last 72 hours. No results for input(s): LABURIN in the last 72 hours. No results found for this or any previous visit.  Studies/Results: No results found.  Assessment/Plan: 1 Day Post-Op Procedure(s) (LRB): ROBOTIC ASSISTED LAPAROSCOPIC RADICAL PROSTATECTOMY WITH INDOCYANINE GREEN DYE (N/A) BILATERAL LYMPHADENECTOMY (Bilateral)  POD#1 RALP & BPLND. Recovering well.   Regular diet  [place]  Ambulate  Monitor JP output  Consider JP Cr prior to d/c  Possible PM d/c vs tomorrow AM   LOS: 1 day   Acie Fredrickson 01/05/2015, 7:03 AM   I have seen and examined the patient and agree with above plan as outlined here as well as DC summary that I authored from same  date.

## 2015-02-01 DIAGNOSIS — C61 Malignant neoplasm of prostate: Secondary | ICD-10-CM | POA: Diagnosis not present

## 2015-02-01 DIAGNOSIS — M6281 Muscle weakness (generalized): Secondary | ICD-10-CM | POA: Diagnosis not present

## 2015-02-15 DIAGNOSIS — R972 Elevated prostate specific antigen [PSA]: Secondary | ICD-10-CM | POA: Diagnosis not present

## 2015-02-15 DIAGNOSIS — M6281 Muscle weakness (generalized): Secondary | ICD-10-CM | POA: Diagnosis not present

## 2015-02-15 DIAGNOSIS — C61 Malignant neoplasm of prostate: Secondary | ICD-10-CM | POA: Diagnosis not present

## 2015-03-14 DIAGNOSIS — M6281 Muscle weakness (generalized): Secondary | ICD-10-CM | POA: Diagnosis not present

## 2015-03-14 DIAGNOSIS — C61 Malignant neoplasm of prostate: Secondary | ICD-10-CM | POA: Diagnosis not present

## 2015-03-15 DIAGNOSIS — Z1389 Encounter for screening for other disorder: Secondary | ICD-10-CM | POA: Diagnosis not present

## 2015-03-15 DIAGNOSIS — N529 Male erectile dysfunction, unspecified: Secondary | ICD-10-CM | POA: Diagnosis not present

## 2015-03-15 DIAGNOSIS — E291 Testicular hypofunction: Secondary | ICD-10-CM | POA: Diagnosis not present

## 2015-03-15 DIAGNOSIS — K219 Gastro-esophageal reflux disease without esophagitis: Secondary | ICD-10-CM | POA: Diagnosis not present

## 2015-03-15 DIAGNOSIS — Z79899 Other long term (current) drug therapy: Secondary | ICD-10-CM | POA: Diagnosis not present

## 2015-03-15 DIAGNOSIS — Z86010 Personal history of colonic polyps: Secondary | ICD-10-CM | POA: Diagnosis not present

## 2015-03-15 DIAGNOSIS — E559 Vitamin D deficiency, unspecified: Secondary | ICD-10-CM | POA: Diagnosis not present

## 2015-03-15 DIAGNOSIS — Z0001 Encounter for general adult medical examination with abnormal findings: Secondary | ICD-10-CM | POA: Diagnosis not present

## 2015-03-15 DIAGNOSIS — E782 Mixed hyperlipidemia: Secondary | ICD-10-CM | POA: Diagnosis not present

## 2015-03-15 DIAGNOSIS — C61 Malignant neoplasm of prostate: Secondary | ICD-10-CM | POA: Diagnosis not present

## 2015-03-15 DIAGNOSIS — I10 Essential (primary) hypertension: Secondary | ICD-10-CM | POA: Diagnosis not present

## 2015-04-11 DIAGNOSIS — N393 Stress incontinence (female) (male): Secondary | ICD-10-CM | POA: Diagnosis not present

## 2015-04-11 DIAGNOSIS — C61 Malignant neoplasm of prostate: Secondary | ICD-10-CM | POA: Diagnosis not present

## 2015-04-25 DIAGNOSIS — M6281 Muscle weakness (generalized): Secondary | ICD-10-CM | POA: Diagnosis not present

## 2015-04-25 DIAGNOSIS — N393 Stress incontinence (female) (male): Secondary | ICD-10-CM | POA: Diagnosis not present

## 2015-05-05 DIAGNOSIS — N393 Stress incontinence (female) (male): Secondary | ICD-10-CM | POA: Diagnosis not present

## 2015-05-05 DIAGNOSIS — M6281 Muscle weakness (generalized): Secondary | ICD-10-CM | POA: Diagnosis not present

## 2015-05-18 DIAGNOSIS — C61 Malignant neoplasm of prostate: Secondary | ICD-10-CM | POA: Diagnosis not present

## 2015-05-19 DIAGNOSIS — M6281 Muscle weakness (generalized): Secondary | ICD-10-CM | POA: Diagnosis not present

## 2015-05-19 DIAGNOSIS — N393 Stress incontinence (female) (male): Secondary | ICD-10-CM | POA: Diagnosis not present

## 2015-05-25 DIAGNOSIS — N393 Stress incontinence (female) (male): Secondary | ICD-10-CM | POA: Diagnosis not present

## 2015-05-25 DIAGNOSIS — Z0000 Encounter for general adult medical examination without abnormal findings: Secondary | ICD-10-CM | POA: Diagnosis not present

## 2015-05-25 DIAGNOSIS — C61 Malignant neoplasm of prostate: Secondary | ICD-10-CM | POA: Diagnosis not present

## 2015-05-25 DIAGNOSIS — N5201 Erectile dysfunction due to arterial insufficiency: Secondary | ICD-10-CM | POA: Diagnosis not present

## 2015-06-01 DIAGNOSIS — N393 Stress incontinence (female) (male): Secondary | ICD-10-CM | POA: Diagnosis not present

## 2015-06-01 DIAGNOSIS — M6281 Muscle weakness (generalized): Secondary | ICD-10-CM | POA: Diagnosis not present

## 2015-06-09 DIAGNOSIS — S93401A Sprain of unspecified ligament of right ankle, initial encounter: Secondary | ICD-10-CM | POA: Diagnosis not present

## 2015-06-12 DIAGNOSIS — M6281 Muscle weakness (generalized): Secondary | ICD-10-CM | POA: Diagnosis not present

## 2015-06-12 DIAGNOSIS — N393 Stress incontinence (female) (male): Secondary | ICD-10-CM | POA: Diagnosis not present

## 2015-07-10 DIAGNOSIS — R278 Other lack of coordination: Secondary | ICD-10-CM | POA: Diagnosis not present

## 2015-07-10 DIAGNOSIS — M6281 Muscle weakness (generalized): Secondary | ICD-10-CM | POA: Diagnosis not present

## 2015-07-10 DIAGNOSIS — N393 Stress incontinence (female) (male): Secondary | ICD-10-CM | POA: Diagnosis not present

## 2015-08-14 DIAGNOSIS — N393 Stress incontinence (female) (male): Secondary | ICD-10-CM | POA: Diagnosis not present

## 2015-08-14 DIAGNOSIS — M6281 Muscle weakness (generalized): Secondary | ICD-10-CM | POA: Diagnosis not present

## 2015-08-14 DIAGNOSIS — R278 Other lack of coordination: Secondary | ICD-10-CM | POA: Diagnosis not present

## 2015-09-24 DIAGNOSIS — L239 Allergic contact dermatitis, unspecified cause: Secondary | ICD-10-CM | POA: Diagnosis not present

## 2015-10-03 DIAGNOSIS — R278 Other lack of coordination: Secondary | ICD-10-CM | POA: Diagnosis not present

## 2015-10-03 DIAGNOSIS — N393 Stress incontinence (female) (male): Secondary | ICD-10-CM | POA: Diagnosis not present

## 2015-10-03 DIAGNOSIS — M6281 Muscle weakness (generalized): Secondary | ICD-10-CM | POA: Diagnosis not present

## 2015-10-09 DIAGNOSIS — R278 Other lack of coordination: Secondary | ICD-10-CM | POA: Diagnosis not present

## 2015-10-09 DIAGNOSIS — N393 Stress incontinence (female) (male): Secondary | ICD-10-CM | POA: Diagnosis not present

## 2015-10-09 DIAGNOSIS — M6281 Muscle weakness (generalized): Secondary | ICD-10-CM | POA: Diagnosis not present

## 2015-10-09 DIAGNOSIS — C61 Malignant neoplasm of prostate: Secondary | ICD-10-CM | POA: Diagnosis not present

## 2015-11-01 DIAGNOSIS — Z23 Encounter for immunization: Secondary | ICD-10-CM | POA: Diagnosis not present

## 2015-11-14 DIAGNOSIS — D485 Neoplasm of uncertain behavior of skin: Secondary | ICD-10-CM | POA: Diagnosis not present

## 2015-11-20 DIAGNOSIS — L821 Other seborrheic keratosis: Secondary | ICD-10-CM | POA: Diagnosis not present

## 2015-11-20 DIAGNOSIS — B079 Viral wart, unspecified: Secondary | ICD-10-CM | POA: Diagnosis not present

## 2015-11-20 DIAGNOSIS — L814 Other melanin hyperpigmentation: Secondary | ICD-10-CM | POA: Diagnosis not present

## 2015-11-24 DIAGNOSIS — C61 Malignant neoplasm of prostate: Secondary | ICD-10-CM | POA: Diagnosis not present

## 2015-12-04 DIAGNOSIS — Z8546 Personal history of malignant neoplasm of prostate: Secondary | ICD-10-CM | POA: Diagnosis not present

## 2015-12-04 DIAGNOSIS — N393 Stress incontinence (female) (male): Secondary | ICD-10-CM | POA: Diagnosis not present

## 2016-03-18 DIAGNOSIS — E291 Testicular hypofunction: Secondary | ICD-10-CM | POA: Diagnosis not present

## 2016-03-18 DIAGNOSIS — E559 Vitamin D deficiency, unspecified: Secondary | ICD-10-CM | POA: Diagnosis not present

## 2016-03-18 DIAGNOSIS — E782 Mixed hyperlipidemia: Secondary | ICD-10-CM | POA: Diagnosis not present

## 2016-03-18 DIAGNOSIS — C61 Malignant neoplasm of prostate: Secondary | ICD-10-CM | POA: Diagnosis not present

## 2016-03-18 DIAGNOSIS — Z0000 Encounter for general adult medical examination without abnormal findings: Secondary | ICD-10-CM | POA: Diagnosis not present

## 2016-03-18 DIAGNOSIS — Z86010 Personal history of colonic polyps: Secondary | ICD-10-CM | POA: Diagnosis not present

## 2016-03-18 DIAGNOSIS — K219 Gastro-esophageal reflux disease without esophagitis: Secondary | ICD-10-CM | POA: Diagnosis not present

## 2016-03-18 DIAGNOSIS — N529 Male erectile dysfunction, unspecified: Secondary | ICD-10-CM | POA: Diagnosis not present

## 2016-03-18 DIAGNOSIS — Z1389 Encounter for screening for other disorder: Secondary | ICD-10-CM | POA: Diagnosis not present

## 2016-03-18 DIAGNOSIS — I10 Essential (primary) hypertension: Secondary | ICD-10-CM | POA: Diagnosis not present

## 2016-03-18 DIAGNOSIS — Z79899 Other long term (current) drug therapy: Secondary | ICD-10-CM | POA: Diagnosis not present

## 2016-06-04 DIAGNOSIS — C61 Malignant neoplasm of prostate: Secondary | ICD-10-CM | POA: Diagnosis not present

## 2016-06-11 DIAGNOSIS — N5201 Erectile dysfunction due to arterial insufficiency: Secondary | ICD-10-CM | POA: Diagnosis not present

## 2016-06-11 DIAGNOSIS — C61 Malignant neoplasm of prostate: Secondary | ICD-10-CM | POA: Diagnosis not present

## 2016-06-11 DIAGNOSIS — E291 Testicular hypofunction: Secondary | ICD-10-CM | POA: Diagnosis not present

## 2016-06-11 DIAGNOSIS — N393 Stress incontinence (female) (male): Secondary | ICD-10-CM | POA: Diagnosis not present

## 2016-07-08 DIAGNOSIS — E782 Mixed hyperlipidemia: Secondary | ICD-10-CM | POA: Diagnosis not present

## 2016-08-22 DIAGNOSIS — R310 Gross hematuria: Secondary | ICD-10-CM | POA: Diagnosis not present

## 2016-08-28 DIAGNOSIS — R310 Gross hematuria: Secondary | ICD-10-CM | POA: Diagnosis not present

## 2016-10-03 DIAGNOSIS — E291 Testicular hypofunction: Secondary | ICD-10-CM | POA: Diagnosis not present

## 2016-10-03 DIAGNOSIS — C61 Malignant neoplasm of prostate: Secondary | ICD-10-CM | POA: Diagnosis not present

## 2016-10-03 DIAGNOSIS — R310 Gross hematuria: Secondary | ICD-10-CM | POA: Diagnosis not present

## 2016-10-03 DIAGNOSIS — N393 Stress incontinence (female) (male): Secondary | ICD-10-CM | POA: Diagnosis not present

## 2016-10-14 DIAGNOSIS — R310 Gross hematuria: Secondary | ICD-10-CM | POA: Diagnosis not present

## 2016-11-03 DIAGNOSIS — Z23 Encounter for immunization: Secondary | ICD-10-CM | POA: Diagnosis not present

## 2017-02-27 DIAGNOSIS — H35033 Hypertensive retinopathy, bilateral: Secondary | ICD-10-CM | POA: Diagnosis not present

## 2017-02-27 DIAGNOSIS — I10 Essential (primary) hypertension: Secondary | ICD-10-CM | POA: Diagnosis not present

## 2017-02-27 DIAGNOSIS — H43813 Vitreous degeneration, bilateral: Secondary | ICD-10-CM | POA: Diagnosis not present

## 2017-02-27 DIAGNOSIS — H2513 Age-related nuclear cataract, bilateral: Secondary | ICD-10-CM | POA: Diagnosis not present

## 2017-03-25 DIAGNOSIS — Z8546 Personal history of malignant neoplasm of prostate: Secondary | ICD-10-CM | POA: Diagnosis not present

## 2017-03-25 DIAGNOSIS — Z1211 Encounter for screening for malignant neoplasm of colon: Secondary | ICD-10-CM | POA: Diagnosis not present

## 2017-03-25 DIAGNOSIS — E782 Mixed hyperlipidemia: Secondary | ICD-10-CM | POA: Diagnosis not present

## 2017-03-25 DIAGNOSIS — E291 Testicular hypofunction: Secondary | ICD-10-CM | POA: Diagnosis not present

## 2017-03-25 DIAGNOSIS — Z0000 Encounter for general adult medical examination without abnormal findings: Secondary | ICD-10-CM | POA: Diagnosis not present

## 2017-03-25 DIAGNOSIS — Z86010 Personal history of colonic polyps: Secondary | ICD-10-CM | POA: Diagnosis not present

## 2017-03-25 DIAGNOSIS — K219 Gastro-esophageal reflux disease without esophagitis: Secondary | ICD-10-CM | POA: Diagnosis not present

## 2017-03-25 DIAGNOSIS — Z79899 Other long term (current) drug therapy: Secondary | ICD-10-CM | POA: Diagnosis not present

## 2017-03-25 DIAGNOSIS — E559 Vitamin D deficiency, unspecified: Secondary | ICD-10-CM | POA: Diagnosis not present

## 2017-03-25 DIAGNOSIS — I10 Essential (primary) hypertension: Secondary | ICD-10-CM | POA: Diagnosis not present

## 2017-03-25 DIAGNOSIS — Z1389 Encounter for screening for other disorder: Secondary | ICD-10-CM | POA: Diagnosis not present

## 2017-03-25 DIAGNOSIS — N529 Male erectile dysfunction, unspecified: Secondary | ICD-10-CM | POA: Diagnosis not present

## 2017-04-02 DIAGNOSIS — Z86010 Personal history of colonic polyps: Secondary | ICD-10-CM | POA: Diagnosis not present

## 2017-04-02 DIAGNOSIS — K219 Gastro-esophageal reflux disease without esophagitis: Secondary | ICD-10-CM | POA: Diagnosis not present

## 2017-04-29 DIAGNOSIS — K644 Residual hemorrhoidal skin tags: Secondary | ICD-10-CM | POA: Diagnosis not present

## 2017-04-29 DIAGNOSIS — K635 Polyp of colon: Secondary | ICD-10-CM | POA: Diagnosis not present

## 2017-04-29 DIAGNOSIS — Z86010 Personal history of colonic polyps: Secondary | ICD-10-CM | POA: Diagnosis not present

## 2017-04-29 DIAGNOSIS — D175 Benign lipomatous neoplasm of intra-abdominal organs: Secondary | ICD-10-CM | POA: Diagnosis not present

## 2017-04-29 DIAGNOSIS — K6389 Other specified diseases of intestine: Secondary | ICD-10-CM | POA: Diagnosis not present

## 2017-05-05 DIAGNOSIS — K635 Polyp of colon: Secondary | ICD-10-CM | POA: Diagnosis not present

## 2017-05-05 DIAGNOSIS — K6389 Other specified diseases of intestine: Secondary | ICD-10-CM | POA: Diagnosis not present

## 2017-08-19 DIAGNOSIS — R319 Hematuria, unspecified: Secondary | ICD-10-CM | POA: Diagnosis not present

## 2017-08-19 DIAGNOSIS — M545 Low back pain: Secondary | ICD-10-CM | POA: Diagnosis not present

## 2017-10-06 DIAGNOSIS — C61 Malignant neoplasm of prostate: Secondary | ICD-10-CM | POA: Diagnosis not present

## 2017-10-14 DIAGNOSIS — R310 Gross hematuria: Secondary | ICD-10-CM | POA: Diagnosis not present

## 2017-10-14 DIAGNOSIS — C61 Malignant neoplasm of prostate: Secondary | ICD-10-CM | POA: Diagnosis not present

## 2017-10-14 DIAGNOSIS — N393 Stress incontinence (female) (male): Secondary | ICD-10-CM | POA: Diagnosis not present

## 2017-10-14 DIAGNOSIS — N5201 Erectile dysfunction due to arterial insufficiency: Secondary | ICD-10-CM | POA: Diagnosis not present

## 2017-11-11 DIAGNOSIS — Z23 Encounter for immunization: Secondary | ICD-10-CM | POA: Diagnosis not present

## 2017-12-30 DIAGNOSIS — L570 Actinic keratosis: Secondary | ICD-10-CM | POA: Diagnosis not present

## 2017-12-30 DIAGNOSIS — L820 Inflamed seborrheic keratosis: Secondary | ICD-10-CM | POA: Diagnosis not present

## 2017-12-30 DIAGNOSIS — S39012A Strain of muscle, fascia and tendon of lower back, initial encounter: Secondary | ICD-10-CM | POA: Diagnosis not present

## 2018-04-13 DIAGNOSIS — Z86010 Personal history of colonic polyps: Secondary | ICD-10-CM | POA: Diagnosis not present

## 2018-04-13 DIAGNOSIS — E291 Testicular hypofunction: Secondary | ICD-10-CM | POA: Diagnosis not present

## 2018-04-13 DIAGNOSIS — Z1389 Encounter for screening for other disorder: Secondary | ICD-10-CM | POA: Diagnosis not present

## 2018-04-13 DIAGNOSIS — Z0000 Encounter for general adult medical examination without abnormal findings: Secondary | ICD-10-CM | POA: Diagnosis not present

## 2018-04-13 DIAGNOSIS — E782 Mixed hyperlipidemia: Secondary | ICD-10-CM | POA: Diagnosis not present

## 2018-04-13 DIAGNOSIS — I10 Essential (primary) hypertension: Secondary | ICD-10-CM | POA: Diagnosis not present

## 2018-04-13 DIAGNOSIS — J309 Allergic rhinitis, unspecified: Secondary | ICD-10-CM | POA: Diagnosis not present

## 2018-04-13 DIAGNOSIS — K219 Gastro-esophageal reflux disease without esophagitis: Secondary | ICD-10-CM | POA: Diagnosis not present

## 2018-04-13 DIAGNOSIS — E559 Vitamin D deficiency, unspecified: Secondary | ICD-10-CM | POA: Diagnosis not present

## 2018-04-13 DIAGNOSIS — R319 Hematuria, unspecified: Secondary | ICD-10-CM | POA: Diagnosis not present

## 2018-04-13 DIAGNOSIS — S39012A Strain of muscle, fascia and tendon of lower back, initial encounter: Secondary | ICD-10-CM | POA: Diagnosis not present

## 2018-04-13 DIAGNOSIS — L820 Inflamed seborrheic keratosis: Secondary | ICD-10-CM | POA: Diagnosis not present

## 2018-04-13 DIAGNOSIS — C61 Malignant neoplasm of prostate: Secondary | ICD-10-CM | POA: Diagnosis not present

## 2018-06-03 DIAGNOSIS — S61210A Laceration without foreign body of right index finger without damage to nail, initial encounter: Secondary | ICD-10-CM | POA: Diagnosis not present

## 2018-06-03 DIAGNOSIS — Z23 Encounter for immunization: Secondary | ICD-10-CM | POA: Diagnosis not present

## 2018-09-04 DIAGNOSIS — L820 Inflamed seborrheic keratosis: Secondary | ICD-10-CM | POA: Diagnosis not present

## 2018-09-04 DIAGNOSIS — E782 Mixed hyperlipidemia: Secondary | ICD-10-CM | POA: Diagnosis not present

## 2018-09-13 DIAGNOSIS — S6992XA Unspecified injury of left wrist, hand and finger(s), initial encounter: Secondary | ICD-10-CM | POA: Diagnosis not present

## 2018-09-13 DIAGNOSIS — S61243A Puncture wound with foreign body of left middle finger without damage to nail, initial encounter: Secondary | ICD-10-CM | POA: Diagnosis not present

## 2018-10-09 DIAGNOSIS — C61 Malignant neoplasm of prostate: Secondary | ICD-10-CM | POA: Diagnosis not present

## 2018-10-20 DIAGNOSIS — N393 Stress incontinence (female) (male): Secondary | ICD-10-CM | POA: Diagnosis not present

## 2018-10-20 DIAGNOSIS — C61 Malignant neoplasm of prostate: Secondary | ICD-10-CM | POA: Diagnosis not present

## 2018-10-20 DIAGNOSIS — E291 Testicular hypofunction: Secondary | ICD-10-CM | POA: Diagnosis not present

## 2018-10-20 DIAGNOSIS — N5201 Erectile dysfunction due to arterial insufficiency: Secondary | ICD-10-CM | POA: Diagnosis not present

## 2019-02-27 ENCOUNTER — Ambulatory Visit: Payer: Medicare Other

## 2019-03-06 ENCOUNTER — Ambulatory Visit: Payer: Medicare Other | Attending: Internal Medicine

## 2019-03-06 ENCOUNTER — Ambulatory Visit: Payer: Medicare Other

## 2019-03-06 DIAGNOSIS — Z23 Encounter for immunization: Secondary | ICD-10-CM | POA: Insufficient documentation

## 2019-03-06 NOTE — Progress Notes (Signed)
   Covid-19 Vaccination Clinic  Name:  [name]    MRN: [id number] DOB: 1945-07-09  03/06/2019  [name] was observed post Covid-19 immunization for 15 minutes without incidence. He was provided with Vaccine Information Sheet and instruction to access the V-Safe system.   [name] was instructed to call 911 with any severe reactions post vaccine: Marland Kitchen Difficulty breathing  . Swelling of your face and throat  . A fast heartbeat  . A bad rash all over your body  . Dizziness and weakness    Immunizations Administered    Name Date Dose VIS Date Route   Pfizer COVID-19 Vaccine 03/06/2019  4:48 PM 0.3 mL 01/08/2019 Intramuscular   Manufacturer: [place]   Lot: [id number]   [place]: [id number]

## 2019-03-10 ENCOUNTER — Ambulatory Visit: Payer: Medicare Other

## 2019-03-31 ENCOUNTER — Ambulatory Visit: Payer: Medicare Other | Attending: Internal Medicine

## 2019-03-31 DIAGNOSIS — Z23 Encounter for immunization: Secondary | ICD-10-CM | POA: Insufficient documentation

## 2019-03-31 NOTE — Progress Notes (Signed)
   Covid-19 Vaccination Clinic  Name:  [name]    MRN: [id number] DOB: 08/18/1945  03/31/2019  [name] was observed post Covid-19 immunization for 15 minutes without incident. He was provided with Vaccine Information Sheet and instruction to access the V-Safe system.   [name] was instructed to call 911 with any severe reactions post vaccine: Marland Kitchen Difficulty breathing  . Swelling of face and throat  . A fast heartbeat  . A bad rash all over body  . Dizziness and weakness   Immunizations Administered    Name Date Dose VIS Date Route   Pfizer COVID-19 Vaccine 03/31/2019  2:07 PM 0.3 mL 01/08/2019 Intramuscular   Manufacturer: [place]   Lot: [id number]   [place]: [id number]

## 2019-04-15 DIAGNOSIS — I10 Essential (primary) hypertension: Secondary | ICD-10-CM | POA: Diagnosis not present

## 2019-04-15 DIAGNOSIS — J309 Allergic rhinitis, unspecified: Secondary | ICD-10-CM | POA: Diagnosis not present

## 2019-04-15 DIAGNOSIS — Z1389 Encounter for screening for other disorder: Secondary | ICD-10-CM | POA: Diagnosis not present

## 2019-04-15 DIAGNOSIS — E291 Testicular hypofunction: Secondary | ICD-10-CM | POA: Diagnosis not present

## 2019-04-15 DIAGNOSIS — K219 Gastro-esophageal reflux disease without esophagitis: Secondary | ICD-10-CM | POA: Diagnosis not present

## 2019-04-15 DIAGNOSIS — Z86010 Personal history of colonic polyps: Secondary | ICD-10-CM | POA: Diagnosis not present

## 2019-04-15 DIAGNOSIS — E782 Mixed hyperlipidemia: Secondary | ICD-10-CM | POA: Diagnosis not present

## 2019-04-15 DIAGNOSIS — E559 Vitamin D deficiency, unspecified: Secondary | ICD-10-CM | POA: Diagnosis not present

## 2019-04-15 DIAGNOSIS — L820 Inflamed seborrheic keratosis: Secondary | ICD-10-CM | POA: Diagnosis not present

## 2019-04-15 DIAGNOSIS — S39012A Strain of muscle, fascia and tendon of lower back, initial encounter: Secondary | ICD-10-CM | POA: Diagnosis not present

## 2019-04-15 DIAGNOSIS — Z0000 Encounter for general adult medical examination without abnormal findings: Secondary | ICD-10-CM | POA: Diagnosis not present

## 2019-04-15 DIAGNOSIS — C61 Malignant neoplasm of prostate: Secondary | ICD-10-CM | POA: Diagnosis not present

## 2019-05-13 DIAGNOSIS — I10 Essential (primary) hypertension: Secondary | ICD-10-CM | POA: Diagnosis not present

## 2019-07-16 DIAGNOSIS — W57XXXA Bitten or stung by nonvenomous insect and other nonvenomous arthropods, initial encounter: Secondary | ICD-10-CM | POA: Diagnosis not present

## 2019-07-16 DIAGNOSIS — R6883 Chills (without fever): Secondary | ICD-10-CM | POA: Diagnosis not present

## 2019-07-16 DIAGNOSIS — R519 Headache, unspecified: Secondary | ICD-10-CM | POA: Diagnosis not present

## 2019-11-02 DIAGNOSIS — Z23 Encounter for immunization: Secondary | ICD-10-CM | POA: Diagnosis not present

## 2019-11-08 DIAGNOSIS — Z23 Encounter for immunization: Secondary | ICD-10-CM | POA: Diagnosis not present

## 2022-03-09 ENCOUNTER — Encounter: Payer: Self-pay | Admitting: Emergency Medicine

## 2022-03-09 ENCOUNTER — Other Ambulatory Visit: Payer: Self-pay

## 2022-03-09 ENCOUNTER — Ambulatory Visit
Admission: EM | Admit: 2022-03-09 | Discharge: 2022-03-09 | Disposition: A | Discharge status: Home / Self Care | Payer: Medicare Other | Attending: Family Medicine | Admitting: Family Medicine

## 2022-03-09 DIAGNOSIS — U071 COVID-19: Secondary | ICD-10-CM | POA: Diagnosis not present

## 2022-03-09 MED ORDER — MOLNUPIRAVIR EUA 200MG CAPSULE: 4.0000 | ORAL_CAPSULE | Freq: Two times a day (BID) | ORAL | 0 refills | Status: AC

## 2022-03-09 MED ORDER — MOLNUPIRAVIR EUA 200MG CAPSULE: 4.0000 | ORAL_CAPSULE | Freq: Two times a day (BID) | ORAL | 0 refills | Status: DC

## 2022-03-09 NOTE — ED Triage Notes (Signed)
Pt reports general malaise, fatigue on Thursday. Pt reports home covid test positive on Friday. Fever yesterday.

## 2022-03-09 NOTE — ED Provider Notes (Signed)
[facility]    CSN: [id number] Arrival date & time: 03/09/22  1123      History   Chief Complaint No chief complaint on file.   HPI [name] is a 77 y.o. male.   Patient presenting today with 4-day history of malaise, fatigue, initially fever that has not gone away, mild cough, dry throat.  States most of his symptoms have already resolved but still having malaise, mild weakness, fatigue.  Denies chest pain, shortness of breath, abdominal pain, nausea vomiting or diarrhea.  So far not tried anything over-the-counter for symptoms.  2 home COVID test have been positive since onset of symptoms.  States he called the [place] and was told to come here to discuss treatment.    Past Medical History:  Diagnosis Date   Esophageal reflux    Hypertension    Prostate cancer ([place]) 07/2013    Patient Active Problem List   Diagnosis Date Noted   Prostate cancer ([place]) 01/04/2015   Malignant neoplasm of prostate ([place]) 09/21/2014    Past Surgical History:  Procedure Laterality Date   LYMPHADENECTOMY Bilateral 01/04/2015   Procedure: BILATERAL LYMPHADENECTOMY;  Surgeon: Alexis Frock, MD;  Location: [facility];  Service: Urology;  Laterality: Bilateral;   PROSTATE BIOPSY  08/23/2013   PROSTATE BIOPSY  08/15/14   ROBOT ASSISTED LAPAROSCOPIC RADICAL PROSTATECTOMY N/A 01/04/2015   Procedure: ROBOTIC ASSISTED LAPAROSCOPIC RADICAL PROSTATECTOMY WITH INDOCYANINE GREEN DYE;  Surgeon: Alexis Frock, MD;  Location: [facility];  Service: Urology;  Laterality: N/A;   TONSILLECTOMY         Home Medications    Prior to Admission medications   Medication Sig Start Date End Date Taking? Authorizing Provider  HYDROcodone-acetaminophen (NORCO) 5-325 MG tablet Take 1-2 tablets by mouth every 6 (six) hours as needed. 01/04/15   Debbrah Alar, PA-C  lisinopril (PRINIVIL,ZESTRIL) 20 MG tablet Take 10 mg by mouth daily.     [provider]  molnupiravir EUA (LAGEVRIO) 200 mg CAPS capsule Take  4 capsules (800 mg total) by mouth 2 (two) times daily for 5 days. 03/09/22 03/14/22  Volney American, PA-C  omeprazole (PRILOSEC OTC) 20 MG tablet Take 20 mg by mouth every other day.     [provider]  rosuvastatin (CRESTOR) 40 MG tablet Take 20 mg by mouth daily.     [provider]  senna-docusate (SENOKOT-S) 8.6-50 MG tablet Take 1 tablet by mouth 2 (two) times daily. While taking pain meds to prevent constipation 01/05/15   Alexis Frock, MD  sulfamethoxazole-trimethoprim (BACTRIM DS,SEPTRA DS) 800-160 MG tablet Take 1 tablet by mouth 2 (two) times daily. Start the day prior to foley removal appointment 01/04/15   Debbrah Alar, PA-C    Family History Family History  Problem Relation Age of Onset   Breast cancer Mother    Stroke Father     Social History Social History   Tobacco Use   Smoking status: Never  Substance Use Topics   Alcohol use: Yes    Alcohol/week: 0.0 standard drinks of alcohol    Comment: 1-2 wine coolers per week.   Drug use: No     Allergies   Patient has no known allergies.   Review of Systems Review of Systems Per HPI  Physical Exam Triage Vital Signs ED Triage Vitals  Enc Vitals Group     BP 03/09/22 1228 (!) 158/99     Pulse Rate 03/09/22 1228 71     Resp 03/09/22 1228 20  Temp 03/09/22 1228 98.1 F (36.7 C)     Temp Source 03/09/22 1228 Oral     SpO2 03/09/22 1228 94 %     Weight --      Height --      Head Circumference --      Peak Flow --      Pain Score 03/09/22 1226 0     Pain Loc --      Pain Edu? --      Excl. in [place]? --    No data found.  Updated Vital Signs BP (!) 158/99 (BP Location: Right Arm)   Pulse 71   Temp 98.1 F (36.7 C) (Oral)   Resp 20   SpO2 94%   Visual Acuity Right Eye Distance:   Left Eye Distance:   Bilateral Distance:    Right Eye Near:   Left Eye Near:    Bilateral Near:     Physical Exam Vitals and nursing note reviewed.  Constitutional:      Appearance: He  is well-developed.  HENT:     Head: Atraumatic.     Right Ear: External ear normal.     Left Ear: External ear normal.     Nose: Nose normal.     Mouth/Throat:     Pharynx: No oropharyngeal exudate or posterior oropharyngeal erythema.  Eyes:     Conjunctiva/sclera: Conjunctivae normal.     Pupils: Pupils are equal, round, and reactive to light.  Cardiovascular:     Rate and Rhythm: Normal rate and regular rhythm.  Pulmonary:     Effort: Pulmonary effort is normal. No respiratory distress.     Breath sounds: No wheezing or rales.  Musculoskeletal:        General: Normal range of motion.     Cervical back: Normal range of motion and neck supple.  Lymphadenopathy:     Cervical: No cervical adenopathy.  Skin:    General: Skin is warm and dry.  Neurological:     Mental Status: He is alert and oriented to person, place, and time.  Psychiatric:        Behavior: Behavior normal.      UC Treatments / Results  Labs (all labs ordered are listed, but only abnormal results are displayed) Labs Reviewed - No data to display  EKG   Radiology No results found.  Procedures Procedures (including critical care time)  Medications Ordered in UC Medications - No data to display  Initial Impression / Assessment and Plan / UC Course  I have reviewed the triage vital signs and the nursing notes.  Pertinent labs & imaging results that were available during my care of the patient were reviewed by me and considered in my medical decision making (see chart for details).     Vitals and exam very reassuring today and symptoms seem to be improving without complication.  Discussed with patient that an antiviral such as molnupiravir would be an option but since his symptoms are already resolving and he is already on day 4 that there may not be a huge benefit.  He is requesting the medication to be sent, this was done today.  Discussed supportive over-the-counter medications, home care.  Return  for worsening symptoms.  Final Clinical Impressions(s) / UC Diagnoses   Final diagnoses:  [id number]   Discharge Instructions   None    ED Prescriptions     Medication Sig Dispense Auth. Provider   molnupiravir EUA (LAGEVRIO) 200 mg CAPS capsule  (Status:  Discontinued) Take 4 capsules (800 mg total) by mouth 2 (two) times daily for 5 days. 40 capsule Volney American, PA-C   molnupiravir EUA (LAGEVRIO) 200 mg CAPS capsule Take 4 capsules (800 mg total) by mouth 2 (two) times daily for 5 days. 40 capsule Volney American, [place]      PDMP not reviewed this encounter.   Volney American, [place] 03/09/22 1332

## 2023-09-12 ENCOUNTER — Emergency Department (HOSPITAL_COMMUNITY)

## 2023-09-12 ENCOUNTER — Other Ambulatory Visit: Payer: Self-pay

## 2023-09-12 ENCOUNTER — Encounter (HOSPITAL_COMMUNITY): Payer: Self-pay | Admitting: *Deleted

## 2023-09-12 ENCOUNTER — Inpatient Hospital Stay (HOSPITAL_COMMUNITY)
Admission: EM | Admit: 2023-09-12 | Discharge: 2023-09-15 | DRG: 690 | Disposition: A | Discharge status: Home / Self Care | Attending: Family Medicine | Admitting: Family Medicine

## 2023-09-12 DIAGNOSIS — E876 Hypokalemia: Secondary | ICD-10-CM | POA: Diagnosis not present

## 2023-09-12 DIAGNOSIS — D649 Anemia, unspecified: Secondary | ICD-10-CM | POA: Diagnosis present

## 2023-09-12 DIAGNOSIS — R5381 Other malaise: Secondary | ICD-10-CM | POA: Diagnosis present

## 2023-09-12 DIAGNOSIS — N179 Acute kidney failure, unspecified: Secondary | ICD-10-CM | POA: Diagnosis present

## 2023-09-12 DIAGNOSIS — N12 Tubulo-interstitial nephritis, not specified as acute or chronic: Principal | ICD-10-CM | POA: Diagnosis present

## 2023-09-12 DIAGNOSIS — Z8546 Personal history of malignant neoplasm of prostate: Secondary | ICD-10-CM

## 2023-09-12 DIAGNOSIS — Z803 Family history of malignant neoplasm of breast: Secondary | ICD-10-CM

## 2023-09-12 DIAGNOSIS — Z823 Family history of stroke: Secondary | ICD-10-CM

## 2023-09-12 DIAGNOSIS — Z79899 Other long term (current) drug therapy: Secondary | ICD-10-CM

## 2023-09-12 DIAGNOSIS — I10 Essential (primary) hypertension: Secondary | ICD-10-CM | POA: Diagnosis present

## 2023-09-12 DIAGNOSIS — N281 Cyst of kidney, acquired: Secondary | ICD-10-CM

## 2023-09-12 DIAGNOSIS — Z9079 Acquired absence of other genital organ(s): Secondary | ICD-10-CM

## 2023-09-12 LAB — CBC
HCT: 38.8 % — ABNORMAL LOW (ref 39.0–52.0)
Hemoglobin: 12.7 g/dL — ABNORMAL LOW (ref 13.0–17.0)
MCH: 28.2 pg (ref 26.0–34.0)
MCHC: 32.7 g/dL (ref 30.0–36.0)
MCV: 86 fL (ref 80.0–100.0)
Platelets: 208 K/uL (ref 150–400)
RBC: 4.51 MIL/uL (ref 4.22–5.81)
RDW: 15 % (ref 11.5–15.5)
WBC: 7.9 K/uL (ref 4.0–10.5)
nRBC: 0 % (ref 0.0–0.2)

## 2023-09-12 LAB — COMPREHENSIVE METABOLIC PANEL WITH GFR
ALT: 16 U/L (ref 0–44)
AST: 21 U/L (ref 15–41)
Albumin: 2.6 g/dL — ABNORMAL LOW (ref 3.5–5.0)
Alkaline Phosphatase: 51 U/L (ref 38–126)
Anion gap: 8 (ref 5–15)
BUN: 24 mg/dL — ABNORMAL HIGH (ref 8–23)
CO2: 23 mmol/L (ref 22–32)
Calcium: 8.4 mg/dL — ABNORMAL LOW (ref 8.9–10.3)
Chloride: 103 mmol/L (ref 98–111)
Creatinine, Ser: 1.13 mg/dL (ref 0.61–1.24)
GFR, Estimated: >60 mL/min (ref >60)
Glucose, Bld: 135 mg/dL — ABNORMAL HIGH (ref 70–99)
Potassium: 3.5 mmol/L (ref 3.5–5.1)
Sodium: 134 mmol/L — ABNORMAL LOW (ref 135–145)
Total Bilirubin: 1 mg/dL (ref 0.0–1.2)
Total Protein: 6.5 g/dL (ref 6.5–8.1)

## 2023-09-12 LAB — LIPASE, BLOOD: Lipase: 34 U/L (ref 11–51)

## 2023-09-12 MED ORDER — IOHEXOL 350 MG/ML SOLN
75.0000 mL | Freq: Once | INTRAVENOUS | Status: AC | PRN
  Administered 2023-09-12: 75 mL via INTRAVENOUS

## 2023-09-12 MED ORDER — LACTATED RINGERS IV BOLUS
1000.0000 mL | Freq: Once | INTRAVENOUS | Status: AC
  Administered 2023-09-12: 1000 mL via INTRAVENOUS

## 2023-09-12 NOTE — ED Provider Notes (Signed)
 [place] EMERGENCY DEPARTMENT AT [facility] Provider Note   CSN: [id number] Arrival date & time: 09/12/23  1713     [name] presents with: Abnormal Lab   [name] is a 78 y.o. male.   HPI 78 year old male presents at the request of his prior care provider for an acute kidney injury.  [name] states that he has not felt well for about 3 days.  Has had low-grade temps of around 100.  Thought it might be COVID because his wife was around home with COVID but he has not had cough, sore throat, shortness of breath.  COVID tests were negative.  He has no chest pain or abdominal pain.  He has been having some intermittent pain in his right flank over these last 3 days that typically is when he is laying down or in a certain position.  No testicular pain, dysuria, hematuria.  He felt nauseated 1 time and had 1 episode of diarrhea.  Had a urinalysis done at the doctor's office and was told to go to the ER.  Right now his pain is minimal.  Prior to Admission medications   Medication Sig Start Date End Date Taking? Authorizing Provider  HYDROcodone-acetaminophen (NORCO) 5-325 MG tablet Take 1-2 tablets by mouth every 6 (six) hours as needed. 01/04/15   Cory Palma, PA-C  lisinopril (PRINIVIL,ZESTRIL) 20 MG tablet Take 10 mg by mouth daily.     [provider]  omeprazole (PRILOSEC OTC) 20 MG tablet Take 20 mg by mouth every other day.     [provider]  rosuvastatin (CRESTOR) 40 MG tablet Take 20 mg by mouth daily.     [provider]  senna-docusate (SENOKOT-S) 8.6-50 MG tablet Take 1 tablet by mouth 2 (two) times daily. While taking pain meds to prevent constipation 01/05/15   Alvaro Ricardo KATHEE Mickey., MD  sulfamethoxazole-trimethoprim (BACTRIM DS,SEPTRA DS) 800-160 MG tablet Take 1 tablet by mouth 2 (two) times daily. Start the day prior to foley removal appointment 01/04/15   Cory Palma, PA-C    Allergies: [name] has no known allergies.    Review  of Systems  Constitutional:  Positive for fever.  Respiratory:  Negative for cough and shortness of breath.   Cardiovascular:  Negative for chest pain.  Gastrointestinal:  Negative for abdominal pain and vomiting.  Genitourinary:  Positive for decreased urine volume (for weeks) and flank pain. Negative for dysuria and hematuria.  Skin:  Negative for rash.    Updated Vital Signs BP 133/81   Pulse 79   Temp 99.6 F (37.6 C)   Resp 15   Ht 6' 1 (1.854 m)   Wt 94.8 kg   SpO2 97%   BMI 27.57 kg/m   Physical Exam Vitals and nursing note reviewed.  Constitutional:      General: He is not in acute distress.    Appearance: He is well-developed. He is not ill-appearing or diaphoretic.  HENT:     Head: Normocephalic and atraumatic.  Pulmonary:     Effort: Pulmonary effort is normal.  Abdominal:     Palpations: Abdomen is soft.   Skin:    General: Skin is warm and dry.     Findings: No rash.  Neurological:     Mental Status: He is alert.     (all labs ordered are listed, but only abnormal results are displayed) Labs Reviewed  COMPREHENSIVE METABOLIC PANEL WITH GFR - Abnormal; Notable for the following components:  Result Value   Sodium 134 (*)    Glucose, Bld 135 (*)    BUN 24 (*)    Calcium  8.4 (*)    Albumin 2.6 (*)    All other components within normal limits  CBC - Abnormal; Notable for the following components:   Hemoglobin 12.7 (*)    HCT 38.8 (*)    All other components within normal limits  LIPASE, BLOOD  URINALYSIS, ROUTINE W REFLEX MICROSCOPIC    EKG: EKG Interpretation Date/Time:  Friday September 12 2023 18:36:47 EDT Ventricular Rate:  86 PR Interval:  152 QRS Duration:  88 QT Interval:  360 QTC Calculation: 430 R Axis:   43  Text Interpretation: Normal sinus rhythm Nonspecific ST abnormality Confirmed by Freddi Hamilton [phone]) on 09/12/2023 10:06:24 PM  Radiology: No results found.   Procedures   Medications Ordered in the ED  lactated  ringers  bolus 1,000 mL (1,000 mLs Intravenous New Bag/Given 09/12/23 2307)  iohexol  (OMNIPAQUE ) 350 MG/ML injection 75 mL (75 mLs Intravenous Contrast Given 09/12/23 2330)                                    Medical Decision Making Amount and/or Complexity of Data Reviewed Labs:     Details: Mildly increased BUN but no AKI.  Mild anemia. Radiology: ordered.  Risk Prescription drug management.   [name] has some mild right flank tenderness/pain.  CT has been ordered but is pending.  Labs overall unremarkable.  Well-appearing.  Given some fluids.  Care transferred to Dr. Griselda.     Final diagnoses:  None    ED Discharge Orders     None          Freddi Hamilton, MD 09/13/23 [phone]

## 2023-09-12 NOTE — ED Provider Triage Note (Signed)
 Emergency Medicine Provider Triage Evaluation Note  [name] , a 78 y.o. male  was evaluated in triage.  Pt complains of abnormal labs.  Patient report was called by his PCPs office with concerns that he has developed an AKI.  Patient has had some sort of viral illness in the last several days and has had some diarrhea and some nausea but no vomiting.  He does report that he has likely had some poor oral intake with lots of days and has been using Tamiflu and aspirin .  Review of Systems  Positive: As above Negative: As above  Physical Exam  BP 131/80 (BP Location: Left Arm)   Pulse 95   Temp 98.4 F (36.9 C)   Resp (!) 25   Ht 6' 1 (1.854 m)   Wt 94.8 kg   SpO2 95%   BMI 27.57 kg/m  Gen:   Awake, no distress   Resp:  Normal effort  MSK:   Moves extremities without difficulty  Other:  No CVA tenderness  Medical Decision Making  Medically screening exam initiated at 6:22 PM.  Appropriate orders placed.  [name] was informed that the remainder of the evaluation will be completed by another provider, this initial triage assessment does not replace that evaluation, and the importance of remaining in the ED until their evaluation is complete.     Sharnese Heath A, PA-C 09/12/23 1823

## 2023-09-12 NOTE — ED Triage Notes (Signed)
 Pt from PCP and states was sent over here for AKI. Pt states urinating less.

## 2023-09-13 DIAGNOSIS — R5381 Other malaise: Secondary | ICD-10-CM

## 2023-09-13 DIAGNOSIS — N281 Cyst of kidney, acquired: Secondary | ICD-10-CM

## 2023-09-13 DIAGNOSIS — D649 Anemia, unspecified: Secondary | ICD-10-CM

## 2023-09-13 DIAGNOSIS — N12 Tubulo-interstitial nephritis, not specified as acute or chronic: Principal | ICD-10-CM

## 2023-09-13 DIAGNOSIS — Z9079 Acquired absence of other genital organ(s): Secondary | ICD-10-CM

## 2023-09-13 DIAGNOSIS — Z8546 Personal history of malignant neoplasm of prostate: Secondary | ICD-10-CM

## 2023-09-13 LAB — URINALYSIS, ROUTINE W REFLEX MICROSCOPIC
Bilirubin Urine: NEGATIVE
Glucose, UA: NEGATIVE mg/dL
Hgb urine dipstick: NEGATIVE
Ketones, ur: NEGATIVE mg/dL
Leukocytes,Ua: NEGATIVE
Nitrite: NEGATIVE
Protein, ur: 100 mg/dL — AB
Specific Gravity, Urine: 1.024 (ref 1.005–1.030)
pH: 5 (ref 5.0–8.0)

## 2023-09-13 LAB — RESP PANEL BY RT-PCR (RSV, FLU A&B, COVID)  RVPGX2
Influenza A by PCR: NEGATIVE
Influenza B by PCR: NEGATIVE
Resp Syncytial Virus by PCR: NEGATIVE
SARS Coronavirus 2 by RT PCR: NEGATIVE

## 2023-09-13 MED ORDER — ACETAMINOPHEN 325 MG PO TABS
650.0000 mg | ORAL_TABLET | Freq: Four times a day (QID) | ORAL | Status: DC | PRN
Start: 2023-09-13 — End: 2023-09-15
  Administered 2023-09-13 – 2023-09-14 (×4): 650 mg via ORAL
  Filled 2023-09-13 (×4): qty 2

## 2023-09-13 MED ORDER — ACETAMINOPHEN 650 MG RE SUPP: 650.0000 mg | Freq: Four times a day (QID) | RECTAL | Status: DC | PRN

## 2023-09-13 MED ORDER — SODIUM CHLORIDE 0.9 % IV SOLN: INTRAVENOUS | Status: AC

## 2023-09-13 MED ORDER — SODIUM CHLORIDE 0.9 % IV SOLN: 2.0000 g | INTRAVENOUS | Status: DC

## 2023-09-13 MED ORDER — SODIUM CHLORIDE 0.9% FLUSH
3.0000 mL | Freq: Two times a day (BID) | INTRAVENOUS | Status: DC
  Administered 2023-09-13 – 2023-09-14 (×3): 3 mL via INTRAVENOUS

## 2023-09-13 MED ORDER — ALBUTEROL SULFATE (2.5 MG/3ML) 0.083% IN NEBU: 2.5000 mg | INHALATION_SOLUTION | Freq: Four times a day (QID) | RESPIRATORY_TRACT | Status: DC | PRN

## 2023-09-13 MED ORDER — SODIUM CHLORIDE 0.9 % IV SOLN
2.0000 g | INTRAVENOUS | Status: DC
  Administered 2023-09-13 – 2023-09-14 (×2): 2 g via INTRAVENOUS
  Filled 2023-09-13 (×2): qty 20

## 2023-09-13 MED ORDER — HYDROCODONE-ACETAMINOPHEN 5-325 MG PO TABS: 1.0000 | ORAL_TABLET | Freq: Four times a day (QID) | ORAL | Status: DC | PRN

## 2023-09-13 MED ORDER — ONDANSETRON HCL 4 MG/2ML IJ SOLN: 4.0000 mg | Freq: Four times a day (QID) | INTRAMUSCULAR | Status: DC | PRN

## 2023-09-13 MED ORDER — ONDANSETRON HCL 4 MG PO TABS: 4.0000 mg | ORAL_TABLET | Freq: Four times a day (QID) | ORAL | Status: DC | PRN

## 2023-09-13 MED ORDER — ENOXAPARIN SODIUM 40 MG/0.4ML IJ SOSY
40.0000 mg | PREFILLED_SYRINGE | INTRAMUSCULAR | Status: DC
  Administered 2023-09-13 – 2023-09-14 (×2): 40 mg via SUBCUTANEOUS
  Filled 2023-09-13 (×2): qty 0.4

## 2023-09-13 MED ORDER — SODIUM CHLORIDE 0.9 % IV SOLN
2.0000 g | Freq: Once | INTRAVENOUS | Status: AC
  Administered 2023-09-13: 2 g via INTRAVENOUS
  Filled 2023-09-13: qty 20

## 2023-09-13 NOTE — ED Notes (Signed)
 Introduced self to patient at this time. Patient is resting in bed with visible chest rise and fall. The call light is in reach. There are no further requests at this time.

## 2023-09-13 NOTE — H&P (Signed)
 History and Physical    Patient: [name] FMW:[id number] DOB: 1946-01-02 DOA: 09/12/2023 DOS: the patient was seen and examined on 09/13/2023 [facility]: [facility]  Patient coming from: Home  Chief Complaint:  Chief Complaint  Patient presents with   Abnormal Lab   HPI: [name] is a 78 y.o. male with medical history significant of hypertension, prostate cancer, and GERDpresents with right-sided back pain and urinary symptoms.  He has been experiencing right-sided back pain for the past three to four days. Initially, he suspected COVID-19 due to exposure. He has been taking Tylenol  PM for symptom relief.  He has a history of prostate cancer and underwent a prostatectomy. Recently, he started using a clamp to manage urinary incontinence, which he began two to three months ago.  He reports intermittent fevers at home, reaching up to 100F, which he managed with Tylenol . Since receiving IV fluids, he feels better but notes his appetite has not fully returned. He mentions a decrease in urine output over the past few days.  In the emergency department patient was noted to have temperature up to 100.3 F with tachypnea, and all other vital signs relatively maintained.  Labs noted WBC 7.9, hemoglobin 12.7, sodium 134, BUN 24, creatinine 1.13, and albumin 2.6.  CT scan of the abdomen and pelvis noted moderate inflammatory changes surrounding the right pole of the renal cysts given concern for pyelonephritis, with small pleural effusion with right lower lobe atelectasis, and unchanged pulmonary nodules.  Urinalysis was positive for many bacteria.  Urine culture was ordered.  Patient was bolused 1 L of lactated Ringer 's and started on Rocephin  IV.  Review of Systems: As mentioned in the history of present illness. All other systems reviewed and are negative. Past Medical History:  Diagnosis Date   Esophageal reflux    Hypertension    Prostate cancer (HCC) 07/2013   Past Surgical  History:  Procedure Laterality Date   LYMPHADENECTOMY Bilateral 01/04/2015   Procedure: BILATERAL LYMPHADENECTOMY;  Surgeon: Ricardo Likens, MD;  Location: [facility];  Service: Urology;  Laterality: Bilateral;   PROSTATE BIOPSY  08/23/2013   PROSTATE BIOPSY  08/15/14   ROBOT ASSISTED LAPAROSCOPIC RADICAL PROSTATECTOMY N/A 01/04/2015   Procedure: ROBOTIC ASSISTED LAPAROSCOPIC RADICAL PROSTATECTOMY WITH INDOCYANINE GREEN  DYE;  Surgeon: Ricardo Likens, MD;  Location: [facility];  Service: Urology;  Laterality: N/A;   TONSILLECTOMY     Social History:  reports that he has never smoked. He does not have any smokeless tobacco history on file. He reports current alcohol use. He reports that he does not use drugs.  No Known Allergies  Family History  Problem Relation Age of Onset   Breast cancer Mother    Stroke Father     Prior to Admission medications   Medication Sig Start Date End Date Taking? Authorizing Provider  cholecalciferol (VITAMIN D3) 25 MCG (1000 UNIT) tablet Take 1,000 Units by mouth daily.   Yes [provider]  lisinopril  (PRINIVIL ,ZESTRIL ) 20 MG tablet Take 10 mg by mouth daily.    Yes [provider]  Methylcobalamin (B12-ACTIVE) 1 MG CHEW Chew 1 mg by mouth daily.   Yes [provider]  omeprazole  (PRILOSEC  OTC) 20 MG tablet Take 20 mg by mouth every other day.    Yes [provider]  rosuvastatin  (CRESTOR ) 40 MG tablet Take 20 mg by mouth daily.    Yes [provider]  valsartan -hydrochlorothiazide  (DIOVAN -HCT) 80-12.5 MG tablet Take 1 tablet by mouth daily. 05/31/23  Yes  [provider]    Physical Exam: Vitals:   09/13/23 0055 09/13/23 0336 09/13/23 0530 09/13/23 0630  BP:  (!) 121/57 137/68 135/87  Pulse:  90 69 63  Resp:  18    Temp: 98.9 F (37.2 C) 100.3 F (37.9 C)    TempSrc: Oral Oral    SpO2:  100% 92% 95%  Weight:      Height:       Constitutional: Elderly male currently in no acute distress Eyes: PERRL,  lids and conjunctivae normal ENMT: Mucous membranes are moist. .Normal dentition.  Neck: normal, supple, no masses, no thyromegaly Respiratory: clear to auscultation bilaterally, no wheezing, no crackles. Normal respiratory effort. No accessory muscle use.  Cardiovascular: Regular rate and rhythm, no murmurs / rubs / gallops. No extremity edema.  Abdomen: right CVA tenderness.  Bowel sounds positive.  Musculoskeletal: no clubbing / cyanosis. No joint deformity upper and lower extremities. Good ROM, no contractures. Normal muscle tone.  Skin: no rashes, lesions, ulcers. No induration Neurologic: CN 2-12 grossly intact. Strength 5/5 in all 4.  Psychiatric: Normal judgment and insight. Alert and oriented x 3. Normal mood.   Data Reviewed:  reviewed labs, imaging, and pertinent records as documented.  Assessment and Plan:  Suspected pyelonephritis Patient presents with fever up to 100.3 F with tachypnea and complaints of right-sided CVA tenderness.  CT imaging and pelvis noted changes surrounding the right upper pole of the renal artery for a ascending urinary tract infection.  Bacteria.  Question if patient's clamping for urinary incontinence put him at increased risk for urinary tract infection.  Patient had been placed on empiric antibiotics of Rocephin . - Admit to a MedSurg bed - Follow-up urine culture - Continue empiric antibiotics of Rocephin  - Hydrocodone  as needed for pain  Generalized malaise Patient reports having generalized with poor appetite.  Initially thought secondary to COVID-19 as his wife had a recent close contact.  Suspect symptoms secondary to above. - Check COVID-19, RSV, and influenza screen  Normocytic anemia Hemoglobin noted to be 12.7.  Patient did not report any complaints of bleeding - Continue to monitor  Right renal cyst Patient has a 6.3 cm cyst that has been slowly growing over the last several years.   - Recommended follow-up outpatient to exclude  possibility of a partially cystic neoplasm  History of prostate cancer S/p prostatectomy Patient reports having significant urinary incontinence for which she started clamping. - Advised patient may more frequent unclamping to prevent this from happening again.  DVT prophylaxis: Lovenox  Advance Care Planning:   Code Status: Full Code   Consults: None  Family Communication: Wife updated at bedside  Severity of Illness: The appropriate patient status for this patient is INPATIENT. Inpatient status is judged to be reasonable and necessary in order to provide the required intensity of service to ensure the patient's safety. The patient's presenting symptoms, physical exam findings, and initial radiographic and laboratory data in the context of their chronic comorbidities is felt to place them at high risk for further clinical deterioration. Furthermore, it is not anticipated that the patient will be medically stable for discharge from the hospital within 2 midnights of admission.   * I certify that at the point of admission it is my clinical judgment that the patient will require inpatient hospital care spanning beyond 2 midnights from the point of admission due to high intensity of service, high risk for further deterioration and high frequency of surveillance required.*  Author: Maximino DELENA Sharps, MD  09/13/2023 7:32 AM  For on call review [email or web address].

## 2023-09-13 NOTE — Plan of Care (Signed)

## 2023-09-14 DIAGNOSIS — Z803 Family history of malignant neoplasm of breast: Secondary | ICD-10-CM | POA: Diagnosis not present

## 2023-09-14 DIAGNOSIS — D649 Anemia, unspecified: Secondary | ICD-10-CM | POA: Diagnosis present

## 2023-09-14 DIAGNOSIS — N12 Tubulo-interstitial nephritis, not specified as acute or chronic: Secondary | ICD-10-CM | POA: Diagnosis present

## 2023-09-14 DIAGNOSIS — I10 Essential (primary) hypertension: Secondary | ICD-10-CM | POA: Diagnosis present

## 2023-09-14 DIAGNOSIS — Z79899 Other long term (current) drug therapy: Secondary | ICD-10-CM | POA: Diagnosis not present

## 2023-09-14 DIAGNOSIS — Z8546 Personal history of malignant neoplasm of prostate: Secondary | ICD-10-CM | POA: Diagnosis not present

## 2023-09-14 DIAGNOSIS — E876 Hypokalemia: Secondary | ICD-10-CM | POA: Diagnosis not present

## 2023-09-14 DIAGNOSIS — N179 Acute kidney failure, unspecified: Secondary | ICD-10-CM | POA: Diagnosis present

## 2023-09-14 DIAGNOSIS — Z823 Family history of stroke: Secondary | ICD-10-CM | POA: Diagnosis not present

## 2023-09-14 DIAGNOSIS — Z9079 Acquired absence of other genital organ(s): Secondary | ICD-10-CM | POA: Diagnosis not present

## 2023-09-14 LAB — CBC
HCT: 35.7 % — ABNORMAL LOW (ref 39.0–52.0)
Hemoglobin: 11.8 g/dL — ABNORMAL LOW (ref 13.0–17.0)
MCH: 28 pg (ref 26.0–34.0)
MCHC: 33.1 g/dL (ref 30.0–36.0)
MCV: 84.6 fL (ref 80.0–100.0)
Platelets: 197 K/uL (ref 150–400)
RBC: 4.22 MIL/uL (ref 4.22–5.81)
RDW: 15 % (ref 11.5–15.5)
WBC: 6.1 K/uL (ref 4.0–10.5)
nRBC: 0 % (ref 0.0–0.2)

## 2023-09-14 LAB — BASIC METABOLIC PANEL WITH GFR
Anion gap: 10 (ref 5–15)
BUN: 16 mg/dL (ref 8–23)
CO2: 22 mmol/L (ref 22–32)
Calcium: 8 mg/dL — ABNORMAL LOW (ref 8.9–10.3)
Chloride: 106 mmol/L (ref 98–111)
Creatinine, Ser: 1.01 mg/dL (ref 0.61–1.24)
GFR, Estimated: >60 mL/min (ref >60)
Glucose, Bld: 120 mg/dL — ABNORMAL HIGH (ref 70–99)
Potassium: 3.1 mmol/L — ABNORMAL LOW (ref 3.5–5.1)
Sodium: 138 mmol/L (ref 135–145)

## 2023-09-14 MED ORDER — POTASSIUM CHLORIDE CRYS ER 20 MEQ PO TBCR
40.0000 meq | EXTENDED_RELEASE_TABLET | Freq: Every day | ORAL | Status: DC
  Administered 2023-09-14: 40 meq via ORAL
  Filled 2023-09-14: qty 2

## 2023-09-14 MED ORDER — ASPIRIN 81 MG PO TBEC
81.0000 mg | DELAYED_RELEASE_TABLET | Freq: Every day | ORAL | Status: DC
  Administered 2023-09-14: 81 mg via ORAL

## 2023-09-14 MED ORDER — ROSUVASTATIN CALCIUM 20 MG PO TABS
40.0000 mg | ORAL_TABLET | Freq: Every day | ORAL | Status: DC
  Administered 2023-09-14: 40 mg via ORAL

## 2023-09-14 MED ORDER — IRBESARTAN 300 MG PO TABS
150.0000 mg | ORAL_TABLET | Freq: Every day | ORAL | Status: DC
  Administered 2023-09-14: 150 mg via ORAL

## 2023-09-14 NOTE — Progress Notes (Signed)
 [facility]   ROUNDING   NOTE Jaime HUEBERT FMW:[id number]  DOB: 11-Jul-1945  DOA: 09/12/2023  PCP: [facility]  09/14/2023,1:35 PM  LOS: 0 days    Code Status: Full code     from: Home current Dispo: Likely home    78 year old white male Prostate cancer status post robotic assisted prostatectomy with bilateral pelvic lymphadenectomy--he has been using a clamp to manage his urinary incontinence 2 to 3 months ago Known underlying left carotid stenosis  Came to the emergency room 8/16 SIRS criteria and WBC 7 BUN/creatinine 24/1.1 CT inflammatory changes right pole renal cyst?  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  Continue aspirin  81 atorvastatin 40  HTN holding Hyzaar at this time because of AKI on admission Adding irbesartan  150 daily check trends tomorrow  AKI on admission mild hypokalemia today Saline lock-give KCl 40 mg  Discussed with wife at bedside  Data Reviewed:   BUN/creatinine 24/1.1-->16/1.0 WBC 6.1 hemoglobin 11.8 platelet 197  DVT prophylaxis: Lovenox   Status is: Observation The patient remains OBS appropriate and will d/c before 2 midnights.     Subjective: Feels better close to his normal no chest pain no flank pain eating and drinking no nausea no vomiting no still not 100%    Objective + exam Vitals:   09/13/23 2149 09/14/23 0036 09/14/23 0348 09/14/23 1004  BP: 137/63 (!) 147/78 125/70 (!) 152/67  Pulse: 70 76 63 72  Resp:  18  18  Temp: 99.3 F (37.4 C) (!) 101.4 F (38.6 C) 98.5 F (36.9 C) 98.1 F (36.7 C)  TempSrc: Oral Oral Oral Oral  SpO2: 93% 90% 92% 93%  Weight:      Height:        Filed Weights   09/12/23 1755  Weight: 94.8 kg     Examination: EOMI NCAT no focal deficit no icterus or pallor Chest clear S1-S2 no murmur Abdomen soft No lower extremity edema No CVA angle tenderness     Scheduled Meds:  enoxaparin  (LOVENOX ) injection  40 mg Subcutaneous Q24H   potassium chloride   40 mEq Oral Daily   sodium chloride  flush  3 mL Intravenous Q12H   Continuous Infusions:  cefTRIAXone  (ROCEPHIN )  IV 2 g (09/13/23 2125)    Time 26  Colen Grimes, MD  [facility]

## 2023-09-14 NOTE — Plan of Care (Signed)
  Problem: Education: Goal: Knowledge of General Education information will improve Description: Including pain rating scale, medication(s)/side effects and non-pharmacologic comfort measures Outcome: Progressing   Problem: Clinical Measurements: Goal: Will remain free from infection Outcome: Progressing   Problem: Activity: Goal: Risk for activity intolerance will decrease Outcome: Progressing   Problem: Nutrition: Goal: Adequate nutrition will be maintained Outcome: Not Progressing

## 2023-09-15 DIAGNOSIS — N12 Tubulo-interstitial nephritis, not specified as acute or chronic: Secondary | ICD-10-CM | POA: Diagnosis not present

## 2023-09-15 LAB — BASIC METABOLIC PANEL WITH GFR
Anion gap: 7 (ref 5–15)
BUN: 17 mg/dL (ref 8–23)
CO2: 21 mmol/L — ABNORMAL LOW (ref 22–32)
Calcium: 7.9 mg/dL — ABNORMAL LOW (ref 8.9–10.3)
Chloride: 107 mmol/L (ref 98–111)
Creatinine, Ser: 0.92 mg/dL (ref 0.61–1.24)
GFR, Estimated: >60 mL/min (ref >60)
Glucose, Bld: 113 mg/dL — ABNORMAL HIGH (ref 70–99)
Potassium: 3.4 mmol/L — ABNORMAL LOW (ref 3.5–5.1)
Sodium: 135 mmol/L (ref 135–145)

## 2023-09-15 LAB — CBC WITH DIFFERENTIAL/PLATELET
Abs Immature Granulocytes: 0.11 K/uL — ABNORMAL HIGH (ref 0.00–0.07)
Basophils Absolute: 0 K/uL (ref 0.0–0.1)
Basophils Relative: 0 %
Eosinophils Absolute: 0.1 K/uL (ref 0.0–0.5)
Eosinophils Relative: 1 %
HCT: 34.5 % — ABNORMAL LOW (ref 39.0–52.0)
Hemoglobin: 11.2 g/dL — ABNORMAL LOW (ref 13.0–17.0)
Immature Granulocytes: 1 %
Lymphocytes Relative: 21 %
Lymphs Abs: 1.6 K/uL (ref 0.7–4.0)
MCH: 28.1 pg (ref 26.0–34.0)
MCHC: 32.5 g/dL (ref 30.0–36.0)
MCV: 86.7 fL (ref 80.0–100.0)
Monocytes Absolute: 1.3 K/uL — ABNORMAL HIGH (ref 0.1–1.0)
Monocytes Relative: 17 %
Neutro Abs: 4.6 K/uL (ref 1.7–7.7)
Neutrophils Relative %: 60 %
Platelets: 215 K/uL (ref 150–400)
RBC: 3.98 MIL/uL — ABNORMAL LOW (ref 4.22–5.81)
RDW: 15.1 % (ref 11.5–15.5)
WBC: 7.7 K/uL (ref 4.0–10.5)
nRBC: 0 % (ref 0.0–0.2)

## 2023-09-15 LAB — URINE CULTURE: Culture: 70000 — AB

## 2023-09-15 MED ORDER — HYDROCODONE-ACETAMINOPHEN 5-325 MG PO TABS: 1.0000 | ORAL_TABLET | Freq: Four times a day (QID) | ORAL | 0 refills | Status: AC | PRN

## 2023-09-15 MED ORDER — CIPROFLOXACIN HCL 500 MG PO TABS: 500.0000 mg | ORAL_TABLET | Freq: Two times a day (BID) | ORAL | 0 refills | Status: AC

## 2023-09-15 MED ORDER — SULFAMETHOXAZOLE-TRIMETHOPRIM 800-160 MG PO TABS
1.0000 | ORAL_TABLET | Freq: Two times a day (BID) | ORAL | Status: DC
  Filled 2023-09-15: qty 1

## 2023-09-15 MED ORDER — VALSARTAN-HYDROCHLOROTHIAZIDE 160-12.5 MG PO TABS: 1.0000 | ORAL_TABLET | Freq: Every day | ORAL | 11 refills | Status: AC

## 2023-09-15 MED ORDER — ONDANSETRON HCL 4 MG PO TABS: 4.0000 mg | ORAL_TABLET | Freq: Four times a day (QID) | ORAL | 0 refills | Status: AC | PRN

## 2023-09-15 NOTE — Plan of Care (Signed)
  Problem: Education: Goal: Knowledge of General Education information will improve Description: Including pain rating scale, medication(s)/side effects and non-pharmacologic comfort measures Outcome: Progressing   Problem: Activity: Goal: Risk for activity intolerance will decrease Outcome: Progressing   Problem: Nutrition: Goal: Adequate nutrition will be maintained  Outcome: Not Progressing 

## 2023-09-15 NOTE — Discharge Summary (Signed)
 Physician Discharge Summary  [name] FMW:[id number] DOB: 12/07/1945 DOA: 09/12/2023  PCP: [facility]  Admit date: 09/12/2023 Discharge date: 09/15/2023  Time spent: 26 minutes  Recommendations for Outpatient Follow-up:  Complete ciprofloxacin  for presumed pyelonephritis based on CT Need labs in a week because of risk of volume depletion Outpatient follow-up please to schedule with urologist for mitigation strategies regarding incontinence also has renal cysts versus other adenopathy and had prostate cancer previously so this may need further characterization  Discharge Diagnoses:  MAIN problem for hospitalization   Pyelonephritis  Please see below for itemized issues addressed in [facility]- refer to other progress notes for clarity if needed  Discharge Condition: Improved  Diet recommendation: Heart healthy  Filed Weights   09/12/23 1755  Weight: 94.8 kg    History of present illness:  78 year old white male Prostate cancer status post robotic assisted prostatectomy with bilateral pelvic lymphadenectomy--he has been using a clamp to manage his urinary incontinence 2 to 3 months ago Known underlying left carotid stenosis   Came to the emergency room 8/16 SIRS criteria and WBC 7 BUN/creatinine 24/1.1 CT inflammatory changes right pole renal cyst?  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  Continue aspirin  81 atorvastatin 40   HTN holding Hyzaar at this time because of AKI on admission Blood pressure is elevated so  resumed all meds as his AKI resolved Force oral fluids   AKI on admission mild hypokalemia today Saline lock-give KCl 40 mg    Discharge Exam: Vitals:   09/15/23 0600 09/15/23 0730  BP: 132/71 (!) 140/75  Pulse: 70 70  Resp: 14   Temp: 98.6 F (37 C) 99 F (37.2 C)  SpO2: 94% 91%    Subj on day of d/c   Awake alert pleasant no distress some mild flank pain  General Exam on discharge  EOMI NCAT no focal deficit no icterus or pallor no wheeze no rales no rhonchi CTAB no added sound Mild CVA tenderness right side when he lays flat on 1 side Chest is clear Abdomen soft no rebound no guarding No lower extremity edema  Discharge Instructions   Discharge Instructions     Diet - low sodium heart healthy   Complete by: As directed    Discharge instructions   Complete by: As directed    Drink copious amounts of water  to clear your kidney to be infection and get labs in about 3 to 4 days Complete the course of ciprofloxacin  Resume your blood pressure medications Follow-up with your primary in about a week and follow-up with your urologist for other strategies to mitigate your incontinence-have a good rest of the summer and take care of yourself   Increase activity slowly   Complete by: As directed       Allergies as of 09/15/2023   No Known Allergies      Medication List     TAKE these medications    aspirin  EC 81 MG tablet Take 81 mg by mouth daily. Swallow whole.   ciprofloxacin  500 MG tablet  Commonly known as: Cipro  Take 1 tablet (500 mg total) by mouth 2 (two) times daily for 5 days.   HYDROcodone -acetaminophen  5-325 MG tablet Commonly known as: NORCO/VICODIN Take 1 tablet by mouth every 6 (six) hours as needed for moderate pain (pain score 4-6).   ondansetron  4 MG tablet Commonly known as: ZOFRAN  Take 1 tablet (4 mg total) by mouth every 6 (six) hours as needed for nausea.   rosuvastatin  40 MG tablet Commonly known as: CRESTOR  Take 40 mg by mouth  daily.   valsartan -hydrochlorothiazide  160-12.5 MG tablet Commonly known as: Diovan  HCT Take 1 tablet by mouth daily.       No Known Allergies    The results of significant diagnostics from this hospitalization (including imaging, microbiology, ancillary and laboratory) are listed below for reference.    Significant Diagnostic Studies: CT ABDOMEN PELVIS W CONTRAST Result Date: 09/13/2023 EXAM: CT ABDOMEN AND PELVIS WITH CONTRAST 09/12/2023 11:34:00 PM TECHNIQUE: CT of the abdomen and pelvis was performed with the administration of intravenous contrast (75mL iohexol  (OMNIPAQUE ) 350 MG/ML injection). Multiplanar reformatted images are provided for review. Automated exposure control, iterative reconstruction, and/or weight based adjustment of the mA/kV was utilized to reduce the radiation dose to as low as reasonably achievable. COMPARISON: 08/28/2016 CLINICAL HISTORY: RLQ abdominal pain. FINDINGS: LOWER CHEST: Small right pleural effusion with right lower lobe atelectasis. 7 mm left lower lobe pulmonary nodule adjacent to the fissure is unchanged since 08/28/2016. A subpleural nodule in the left lower lobe measures 5 mm and is also unchanged since 08/28/2016. LIVER: The liver is unremarkable. GALLBLADDER AND BILE DUCTS: Gallbladder is unremarkable. No biliary ductal dilatation. SPLEEN: No acute abnormality. PANCREAS: No acute abnormality. ADRENAL GLANDS: No acute abnormality. KIDNEYS, URETERS AND BLADDER: Redemonstration of cyst at the right kidney upper pole measuring 6.3 cm, previously 5.4 cm. There is now moderate surrounding inflammatory change. No stones in the kidneys or ureters. No hydronephrosis. No perinephric or periureteral stranding. Urinary bladder is unremarkable. GI AND BOWEL: Stomach demonstrates no acute abnormality. There is no bowel obstruction. No bowel wall thickening. Small hiatal hernia. PERITONEUM AND RETROPERITONEUM: No ascites. No free air. VASCULATURE: Calcific aortic  atherosclerosis. Aorta is normal in caliber. LYMPH NODES: No lymphadenopathy. REPRODUCTIVE ORGANS: No acute abnormality. BONES AND SOFT TISSUES: No acute osseous abnormality. No focal soft tissue abnormality. IMPRESSION: 1. Moderate inflammatory change surrounding right upper pole renal cyst. This might be secondary to ascending urinary tract infection/pyelonephritis. Follow-up imaging recommended to exclude partially cystic neoplasm. 2. Small right pleural effusion and right lower lobe atelectasis. 3. Unchanged 7 mm left lower lobe pulmonary nodule adjacent to the fissure and 5 mm subpleural nodule in the left lower lobe. Electronically signed by: Franky Stanford MD 09/13/2023 12:30 AM EDT RP Workstation: [id number]    Microbiology: Recent Results (from the past 240 hours)  Urine Culture     Status: Abnormal   Collection Time: 09/13/23  1:31 AM   Specimen: Urine, Clean Catch  Result Value Ref Range Status   Specimen Description URINE, CLEAN CATCH  Final   Special Requests   Final    NONE Performed at [facility], [address], [place], [place] [address]    Culture 70,000 COLONIES/mL KLEBSIELLA PNEUMONIAE (A)  Final   Report Status 09/15/2023 FINAL  Final   Organism ID, Bacteria KLEBSIELLA PNEUMONIAE (A)  Final      Susceptibility   Klebsiella pneumoniae - MIC*    AMPICILLIN >=32 RESISTANT Resistant     CEFAZOLIN  (URINE) Value  in next row Sensitive      2 SENSITIVEThis is a modified FDA-approved test that has been validated and its performance characteristics determined by the reporting laboratory.  This laboratory is certified under the Clinical Laboratory Improvement Amendments CLIA as qualified to perform high complexity clinical laboratory testing.    CEFEPIME Value in next row Sensitive      2 SENSITIVEThis is a modified FDA-approved test that has been validated and its performance characteristics determined by the reporting laboratory.  This laboratory is certified under the  Clinical Laboratory Improvement Amendments CLIA as qualified to perform high complexity clinical laboratory testing.    ERTAPENEM Value in next row Sensitive      2 SENSITIVEThis is a modified FDA-approved test that has been validated and its performance characteristics determined by the reporting laboratory.  This laboratory is certified under the Clinical Laboratory Improvement Amendments CLIA as qualified to perform high complexity clinical laboratory testing.    CEFTRIAXONE  Value in next row Sensitive      2 SENSITIVEThis is a modified FDA-approved test that has been validated and its performance characteristics determined by the reporting laboratory.  This laboratory is certified under the Clinical Laboratory Improvement Amendments CLIA as qualified to perform high complexity clinical laboratory testing.    CIPROFLOXACIN  Value in next row Sensitive      2 SENSITIVEThis is a modified FDA-approved test that has been validated and its performance characteristics determined by the reporting laboratory.  This laboratory is certified under the Clinical Laboratory Improvement Amendments CLIA as qualified to perform high complexity clinical laboratory testing.    GENTAMICIN Value in next row Resistant      2 SENSITIVEThis is a modified FDA-approved test that has been validated and its performance characteristics determined by the reporting laboratory.  This laboratory is certified under the Clinical Laboratory Improvement Amendments CLIA as qualified to perform high complexity clinical laboratory testing.    NITROFURANTOIN Value in next row Intermediate      2 SENSITIVEThis is a modified FDA-approved test that has been validated and its performance characteristics determined by the reporting laboratory.  This laboratory is certified under the Clinical Laboratory Improvement Amendments CLIA as qualified to perform high complexity clinical laboratory testing.    TRIMETH /SULFA  Value in next row Sensitive       2 SENSITIVEThis is a modified FDA-approved test that has been validated and its performance characteristics determined by the reporting laboratory.  This laboratory is certified under the Clinical Laboratory Improvement Amendments CLIA as qualified to perform high complexity clinical laboratory testing.    AMPICILLIN/SULBACTAM Value in next row Sensitive      2 SENSITIVEThis is a modified FDA-approved test that has been validated and its performance characteristics determined by the reporting laboratory.  This laboratory is certified under the Clinical Laboratory Improvement Amendments CLIA as qualified to perform high complexity clinical laboratory testing.    PIP/TAZO Value in next row Sensitive ug/mL     <=4 SENSITIVEThis is a modified FDA-approved test that has been validated and its performance characteristics determined by the reporting laboratory.  This laboratory is certified under the Clinical Laboratory Improvement Amendments CLIA as qualified to perform high complexity clinical laboratory testing.    MEROPENEM Value in next row Sensitive      <=4 SENSITIVEThis is a modified FDA-approved test that has been validated and its performance characteristics determined by the reporting laboratory.  This laboratory is certified under the Clinical Laboratory Improvement Amendments CLIA as qualified to perform high complexity clinical  laboratory testing.    * 70,000 COLONIES/mL KLEBSIELLA PNEUMONIAE  Resp panel by RT-PCR (RSV, Flu A&B, Covid) Anterior Nasal Swab     Status: None   Collection Time: 09/13/23  7:49 AM   Specimen: Anterior Nasal Swab  Result Value Ref Range Status   SARS Coronavirus 2 by RT PCR NEGATIVE NEGATIVE Final   Influenza A by PCR NEGATIVE NEGATIVE Final   Influenza B by PCR NEGATIVE NEGATIVE Final    Comment: (NOTE) The Xpert Xpress SARS-CoV-2/FLU/RSV plus assay is intended as an aid in the diagnosis of influenza from Nasopharyngeal swab specimens and should not be used as a  sole basis for treatment. Nasal washings and aspirates are unacceptable for Xpert Xpress SARS-CoV-2/FLU/RSV testing.  Fact Sheet for Patients: BloggerCourse.com  Fact Sheet for Healthcare Providers: SeriousBroker.it  This test is not yet approved or cleared by the [place]  FDA and has been authorized for detection and/or diagnosis of SARS-CoV-2 by FDA under an Emergency Use Authorization (EUA). This EUA will remain in effect (meaning this test can be used) for the duration of the COVID-19 declaration under Section 564(b)(1) of the Act, 21 U.S.C. section 360bbb-3(b)(1), unless the authorization is terminated or revoked.     Resp Syncytial Virus by PCR NEGATIVE NEGATIVE Final    Comment: (NOTE) Fact Sheet for Patients: BloggerCourse.com  Fact Sheet for Healthcare Providers: SeriousBroker.it  This test is not yet approved or cleared by the [place]  FDA and has been authorized for detection and/or diagnosis of SARS-CoV-2 by FDA under an Emergency Use Authorization (EUA). This EUA will remain in effect (meaning this test can be used) for the duration of the COVID-19 declaration under Section 564(b)(1) of the Act, 21 U.S.C. section 360bbb-3(b)(1), unless the authorization is terminated or revoked.  Performed at [facility], [address], [place], [place] [address]      Labs: Basic Metabolic Panel: Recent Labs  Lab 09/12/23 1809 09/14/23 0454 09/15/23 0352  NA 134* 138 135  K 3.5 3.1* 3.4*  CL 103 106 107  CO2 23 22 21*  GLUCOSE 135* 120* 113*  BUN 24* 16 17  CREATININE 1.13 1.01 0.92  CALCIUM  8.4* 8.0* 7.9*   Liver Function Tests: Recent Labs  Lab 09/12/23 1809  AST 21  ALT 16  ALKPHOS 51  BILITOT 1.0  PROT 6.5  ALBUMIN 2.6*   Recent Labs  Lab 09/12/23 1809  LIPASE 34   No results for input(s): AMMONIA in the last 168  hours. CBC: Recent Labs  Lab 09/12/23 1809 09/14/23 0454 09/15/23 0352  WBC 7.9 6.1 7.7  NEUTROABS  --   --  4.6  HGB 12.7* 11.8* 11.2*  HCT 38.8* 35.7* 34.5*  MCV 86.0 84.6 86.7  PLT 208 197 215   Cardiac Enzymes: No results for input(s): CKTOTAL, CKMB, CKMBINDEX, TROPONINI in the last 168 hours. BNP: BNP (last 3 results) No results for input(s): BNP in the last 8760 hours.  ProBNP (last 3 results) No results for input(s): PROBNP in the last 8760 hours.  CBG: No results for input(s): GLUCAP in the last 168 hours.  Signed:  Jai-Gurmukh Ronnetta Currington MD   [facility] 09/15/2023, 8:40 AM

## 2023-09-15 NOTE — TOC CM/SW Note (Signed)
 Transition of Care [facility]) - Inpatient Brief Assessment   Patient Details  Name: [name] MRN: [id number] Date of Birth: 08-Oct-1945  [facility]) CM/SW Contact:    Lauraine FORBES Saa, LCSWA Phone Number: 09/15/2023, 8:55 AM   Clinical Narrative:  8:55 AM Per chart review, patient resides at home alone. Patient has a PCP and insurance. Patient does not have SNF/HH/DME history. Patient's preferred pharmacy's are [facility] [address], [facility] [address], and [facility] No TOC needs were identified at this time. TOC will continue to follow and be available to assist.  Transition of Care Asessment: Insurance and Status: Insurance coverage has been reviewed Patient has primary care physician: Yes Home environment has been reviewed: Private Residence Prior level of function:: N/A Prior/Current Home Services: No current home services Social Drivers of Health Review: SDOH reviewed no interventions necessary Readmission risk has been reviewed: Yes (Currently Green 14%) Transition of care needs: no transition of care needs at this time

## 2023-12-10 ENCOUNTER — Other Ambulatory Visit: Payer: Self-pay | Admitting: Internal Medicine

## 2023-12-10 DIAGNOSIS — R911 Solitary pulmonary nodule: Secondary | ICD-10-CM

## 2023-12-10 DIAGNOSIS — J90 Pleural effusion, not elsewhere classified: Secondary | ICD-10-CM

## 2023-12-19 ENCOUNTER — Other Ambulatory Visit

## 2023-12-19 ENCOUNTER — Ambulatory Visit
Admission: RE | Admit: 2023-12-19 | Discharge: 2023-12-19 | Disposition: A | Discharge status: Home / Self Care | Source: Ambulatory Visit | Attending: Internal Medicine | Admitting: Internal Medicine

## 2023-12-19 DIAGNOSIS — R911 Solitary pulmonary nodule: Secondary | ICD-10-CM

## 2023-12-19 DIAGNOSIS — J90 Pleural effusion, not elsewhere classified: Secondary | ICD-10-CM
